# Patient Record
Sex: Female | Born: 1945 | Race: White | Hispanic: No | Marital: Married | Smoking: Never smoker
Health system: Southern US, Community
[De-identification: ages and names within clinical notes are randomized; demographics above are authoritative.]

## PROBLEM LIST (undated history)

## (undated) DIAGNOSIS — E119 Type 2 diabetes mellitus without complications: Secondary | ICD-10-CM

## (undated) DIAGNOSIS — R011 Cardiac murmur, unspecified: Secondary | ICD-10-CM

## (undated) DIAGNOSIS — E7800 Pure hypercholesterolemia, unspecified: Secondary | ICD-10-CM

## (undated) DIAGNOSIS — F419 Anxiety disorder, unspecified: Secondary | ICD-10-CM

## (undated) DIAGNOSIS — I10 Essential (primary) hypertension: Secondary | ICD-10-CM

## (undated) HISTORY — PX: OTHER SURGICAL HISTORY: SHX169

---

## 2009-03-23 ENCOUNTER — Ambulatory Visit: Payer: Self-pay | Admitting: Diagnostic Radiology

## 2009-03-23 ENCOUNTER — Emergency Department (HOSPITAL_BASED_OUTPATIENT_CLINIC_OR_DEPARTMENT_OTHER): Admission: EM | Admit: 2009-03-23 | Discharge: 2009-03-23 | Payer: Self-pay | Admitting: Emergency Medicine

## 2015-04-17 DIAGNOSIS — Z760 Encounter for issue of repeat prescription: Secondary | ICD-10-CM | POA: Diagnosis not present

## 2015-04-17 DIAGNOSIS — I10 Essential (primary) hypertension: Secondary | ICD-10-CM | POA: Diagnosis not present

## 2015-04-17 DIAGNOSIS — M130 Polyarthritis, unspecified: Secondary | ICD-10-CM | POA: Diagnosis not present

## 2015-04-17 DIAGNOSIS — E119 Type 2 diabetes mellitus without complications: Secondary | ICD-10-CM | POA: Diagnosis not present

## 2015-04-17 DIAGNOSIS — E782 Mixed hyperlipidemia: Secondary | ICD-10-CM | POA: Diagnosis not present

## 2015-08-28 DIAGNOSIS — E119 Type 2 diabetes mellitus without complications: Secondary | ICD-10-CM | POA: Diagnosis not present

## 2015-10-22 DIAGNOSIS — E785 Hyperlipidemia, unspecified: Secondary | ICD-10-CM | POA: Diagnosis not present

## 2015-10-22 DIAGNOSIS — E119 Type 2 diabetes mellitus without complications: Secondary | ICD-10-CM | POA: Diagnosis not present

## 2015-10-22 DIAGNOSIS — I10 Essential (primary) hypertension: Secondary | ICD-10-CM | POA: Diagnosis not present

## 2015-10-22 DIAGNOSIS — Z1159 Encounter for screening for other viral diseases: Secondary | ICD-10-CM | POA: Diagnosis not present

## 2015-10-30 DIAGNOSIS — Z23 Encounter for immunization: Secondary | ICD-10-CM | POA: Diagnosis not present

## 2015-10-30 DIAGNOSIS — I10 Essential (primary) hypertension: Secondary | ICD-10-CM | POA: Diagnosis not present

## 2015-10-30 DIAGNOSIS — S46811A Strain of other muscles, fascia and tendons at shoulder and upper arm level, right arm, initial encounter: Secondary | ICD-10-CM | POA: Diagnosis not present

## 2015-10-30 DIAGNOSIS — S46812A Strain of other muscles, fascia and tendons at shoulder and upper arm level, left arm, initial encounter: Secondary | ICD-10-CM | POA: Diagnosis not present

## 2015-10-30 DIAGNOSIS — E119 Type 2 diabetes mellitus without complications: Secondary | ICD-10-CM | POA: Diagnosis not present

## 2015-10-30 DIAGNOSIS — E785 Hyperlipidemia, unspecified: Secondary | ICD-10-CM | POA: Diagnosis not present

## 2015-12-31 DIAGNOSIS — Z23 Encounter for immunization: Secondary | ICD-10-CM | POA: Diagnosis not present

## 2016-05-05 DIAGNOSIS — Z1212 Encounter for screening for malignant neoplasm of rectum: Secondary | ICD-10-CM | POA: Diagnosis not present

## 2016-05-17 DIAGNOSIS — E119 Type 2 diabetes mellitus without complications: Secondary | ICD-10-CM | POA: Diagnosis not present

## 2016-05-17 DIAGNOSIS — E785 Hyperlipidemia, unspecified: Secondary | ICD-10-CM | POA: Diagnosis not present

## 2016-05-17 DIAGNOSIS — I10 Essential (primary) hypertension: Secondary | ICD-10-CM | POA: Diagnosis not present

## 2016-05-19 DIAGNOSIS — F411 Generalized anxiety disorder: Secondary | ICD-10-CM | POA: Diagnosis not present

## 2016-05-19 DIAGNOSIS — I10 Essential (primary) hypertension: Secondary | ICD-10-CM | POA: Diagnosis not present

## 2016-05-19 DIAGNOSIS — E785 Hyperlipidemia, unspecified: Secondary | ICD-10-CM | POA: Diagnosis not present

## 2016-05-19 DIAGNOSIS — J069 Acute upper respiratory infection, unspecified: Secondary | ICD-10-CM | POA: Diagnosis not present

## 2016-05-19 DIAGNOSIS — E119 Type 2 diabetes mellitus without complications: Secondary | ICD-10-CM | POA: Diagnosis not present

## 2016-05-26 DIAGNOSIS — E119 Type 2 diabetes mellitus without complications: Secondary | ICD-10-CM | POA: Diagnosis not present

## 2016-06-09 DIAGNOSIS — Z1231 Encounter for screening mammogram for malignant neoplasm of breast: Secondary | ICD-10-CM | POA: Diagnosis not present

## 2016-07-01 DIAGNOSIS — H6122 Impacted cerumen, left ear: Secondary | ICD-10-CM | POA: Diagnosis not present

## 2016-12-08 DIAGNOSIS — Z0000 Encounter for general adult medical examination without abnormal findings: Secondary | ICD-10-CM | POA: Diagnosis not present

## 2016-12-09 DIAGNOSIS — I10 Essential (primary) hypertension: Secondary | ICD-10-CM | POA: Diagnosis not present

## 2016-12-09 DIAGNOSIS — M25562 Pain in left knee: Secondary | ICD-10-CM | POA: Diagnosis not present

## 2016-12-09 DIAGNOSIS — E785 Hyperlipidemia, unspecified: Secondary | ICD-10-CM | POA: Diagnosis not present

## 2016-12-09 DIAGNOSIS — E119 Type 2 diabetes mellitus without complications: Secondary | ICD-10-CM | POA: Diagnosis not present

## 2016-12-09 DIAGNOSIS — Z23 Encounter for immunization: Secondary | ICD-10-CM | POA: Diagnosis not present

## 2016-12-26 DIAGNOSIS — M25562 Pain in left knee: Secondary | ICD-10-CM | POA: Diagnosis not present

## 2017-05-16 DIAGNOSIS — I10 Essential (primary) hypertension: Secondary | ICD-10-CM | POA: Diagnosis not present

## 2017-05-16 DIAGNOSIS — E785 Hyperlipidemia, unspecified: Secondary | ICD-10-CM | POA: Diagnosis not present

## 2017-05-16 DIAGNOSIS — R7989 Other specified abnormal findings of blood chemistry: Secondary | ICD-10-CM | POA: Diagnosis not present

## 2017-05-16 DIAGNOSIS — E119 Type 2 diabetes mellitus without complications: Secondary | ICD-10-CM | POA: Diagnosis not present

## 2017-05-16 DIAGNOSIS — Z7984 Long term (current) use of oral hypoglycemic drugs: Secondary | ICD-10-CM | POA: Diagnosis not present

## 2017-05-30 DIAGNOSIS — R7989 Other specified abnormal findings of blood chemistry: Secondary | ICD-10-CM | POA: Diagnosis not present

## 2017-12-08 DIAGNOSIS — H2513 Age-related nuclear cataract, bilateral: Secondary | ICD-10-CM | POA: Diagnosis not present

## 2018-01-04 DIAGNOSIS — E119 Type 2 diabetes mellitus without complications: Secondary | ICD-10-CM | POA: Diagnosis not present

## 2018-01-04 DIAGNOSIS — Z23 Encounter for immunization: Secondary | ICD-10-CM | POA: Diagnosis not present

## 2018-01-04 DIAGNOSIS — E785 Hyperlipidemia, unspecified: Secondary | ICD-10-CM | POA: Diagnosis not present

## 2018-01-04 DIAGNOSIS — I10 Essential (primary) hypertension: Secondary | ICD-10-CM | POA: Diagnosis not present

## 2018-03-11 DIAGNOSIS — J40 Bronchitis, not specified as acute or chronic: Secondary | ICD-10-CM | POA: Diagnosis not present

## 2018-03-11 DIAGNOSIS — J069 Acute upper respiratory infection, unspecified: Secondary | ICD-10-CM | POA: Diagnosis not present

## 2018-03-11 DIAGNOSIS — R531 Weakness: Secondary | ICD-10-CM | POA: Diagnosis not present

## 2018-09-28 ENCOUNTER — Other Ambulatory Visit: Payer: Self-pay

## 2018-12-13 DIAGNOSIS — Z23 Encounter for immunization: Secondary | ICD-10-CM | POA: Diagnosis not present

## 2018-12-20 DIAGNOSIS — E785 Hyperlipidemia, unspecified: Secondary | ICD-10-CM | POA: Diagnosis not present

## 2018-12-20 DIAGNOSIS — I10 Essential (primary) hypertension: Secondary | ICD-10-CM | POA: Diagnosis not present

## 2018-12-20 DIAGNOSIS — Z7984 Long term (current) use of oral hypoglycemic drugs: Secondary | ICD-10-CM | POA: Diagnosis not present

## 2018-12-20 DIAGNOSIS — E119 Type 2 diabetes mellitus without complications: Secondary | ICD-10-CM | POA: Diagnosis not present

## 2019-01-03 DIAGNOSIS — R809 Proteinuria, unspecified: Secondary | ICD-10-CM | POA: Diagnosis not present

## 2019-02-05 ENCOUNTER — Encounter (HOSPITAL_BASED_OUTPATIENT_CLINIC_OR_DEPARTMENT_OTHER): Payer: Self-pay

## 2019-02-05 ENCOUNTER — Emergency Department (HOSPITAL_BASED_OUTPATIENT_CLINIC_OR_DEPARTMENT_OTHER): Payer: PPO

## 2019-02-05 ENCOUNTER — Other Ambulatory Visit: Payer: Self-pay

## 2019-02-05 ENCOUNTER — Emergency Department (HOSPITAL_BASED_OUTPATIENT_CLINIC_OR_DEPARTMENT_OTHER)
Admission: EM | Admit: 2019-02-05 | Discharge: 2019-02-05 | Disposition: A | Discharge status: Home / Self Care | Payer: PPO | Attending: Emergency Medicine | Admitting: Emergency Medicine

## 2019-02-05 DIAGNOSIS — M660 Rupture of popliteal cyst: Secondary | ICD-10-CM | POA: Diagnosis not present

## 2019-02-05 DIAGNOSIS — R600 Localized edema: Secondary | ICD-10-CM | POA: Diagnosis not present

## 2019-02-05 DIAGNOSIS — M7121 Synovial cyst of popliteal space [Baker], right knee: Secondary | ICD-10-CM | POA: Diagnosis not present

## 2019-02-05 DIAGNOSIS — M79661 Pain in right lower leg: Secondary | ICD-10-CM | POA: Diagnosis not present

## 2019-02-05 DIAGNOSIS — E119 Type 2 diabetes mellitus without complications: Secondary | ICD-10-CM | POA: Diagnosis not present

## 2019-02-05 DIAGNOSIS — M7989 Other specified soft tissue disorders: Secondary | ICD-10-CM | POA: Diagnosis not present

## 2019-02-05 HISTORY — DX: Pure hypercholesterolemia, unspecified: E78.00

## 2019-02-05 HISTORY — DX: Essential (primary) hypertension: I10

## 2019-02-05 HISTORY — DX: Type 2 diabetes mellitus without complications: E11.9

## 2019-02-05 NOTE — ED Provider Notes (Signed)
[facility] EMERGENCY DEPARTMENT Provider Note   CSN: [id number] Arrival date & time: 02/05/19  1903     History   Chief Complaint Chief Complaint  Patient presents with  . Leg Swelling    HPI Morgan Hall is a 73 y.o. female.  73 year old female diabetic here with 3 days of right knee and calf pain.  No known trauma.  Went to urgent care and they sent her here to get a duplex.  No prior history of DVT.  Very active.  No numbness or tingling.  She said the pain radiates from the back of her thigh through the back of her knee and down into her calf with some swelling and redness.  No known fever.     The history is provided by the patient.  Leg Pain Location:  Leg and knee Time since incident:  3 days Injury: no   Leg location:  R upper leg and R lower leg Knee location:  R knee Pain details:    Quality:  Aching   Severity:  Moderate   Onset quality:  Gradual   Timing:  Constant   Progression:  Unchanged Chronicity:  New Relieved by:  None tried Worsened by:  Activity Ineffective treatments:  None tried Associated symptoms: back pain (chronic) and swelling   Associated symptoms: no fever, no muscle weakness and no tingling     Past Medical History:  Diagnosis Date  . Diabetes mellitus without complication (HCC)   . High cholesterol   . Hypertension     There are no active problems to display for this patient.   History reviewed. No pertinent surgical history.   OB History   No obstetric history on file.      Home Medications    Prior to Admission medications   Not on File    Family History No family history on file.  Social History Social History   Tobacco Use  . Smoking status: Never Smoker  . Smokeless tobacco: Never Used  Substance Use Topics  . Alcohol use: Never    Frequency: Never  . Drug use: Never     Allergies   Penicillins and Robitussin (alcohol free) [guaifenesin]   Review of Systems Review of Systems   Constitutional: Negative for fever.  HENT: Negative for sore throat.   Eyes: Negative for visual disturbance.  Respiratory: Negative for shortness of breath.   Cardiovascular: Negative for chest pain.  Gastrointestinal: Negative for abdominal pain.  Genitourinary: Negative for dysuria.  Musculoskeletal: Positive for back pain (chronic).  Skin: Negative for rash.  Neurological: Negative for headaches.     Physical Exam Updated Vital Signs BP (!) 190/79 (BP Location: Right Arm)   Pulse 94   Temp 99.1 F (37.3 C) (Oral)   Resp 18   Ht 5\' 2"  (1.575 m)   Wt 72.6 kg   SpO2 97%   BMI 29.26 kg/m   Physical Exam Constitutional:      Appearance: She is well-developed.  HENT:     Head: Normocephalic and atraumatic.  Eyes:     Conjunctiva/sclera: Conjunctivae normal.  Neck:     Musculoskeletal: Neck supple.  Cardiovascular:     Rate and Rhythm: Normal rate and regular rhythm.     Pulses: Normal pulses.  Pulmonary:     Effort: Pulmonary effort is normal.  Abdominal:     Tenderness: There is no abdominal tenderness.  Musculoskeletal: Normal range of motion.        General: Swelling and  tenderness present.     Left lower leg: Edema present.     Comments: She is some tenderness behind her right knee and into her calf.  There is a mild amount of swelling.  No appreciable warmth or erythema.  Distal neurovascular intact.  Left lower extremity unaffected.  Skin:    General: Skin is warm and dry.     Capillary Refill: Capillary refill takes less than 2 seconds.  Neurological:     General: No focal deficit present.     Mental Status: She is alert.     GCS: GCS eye subscore is 4. GCS verbal subscore is 5. GCS motor subscore is 6.      ED Treatments / Results  Labs (all labs ordered are listed, but only abnormal results are displayed) Labs Reviewed - No data to display  EKG None  Radiology US Venous Img Lower Unilateral Right  Result Date: 02/05/2019 CLINICAL DATA:  Pain  EXAM: RIGHT LOWER EXTREMITY VENOUS DOPPLER ULTRASOUND TECHNIQUE: Gray-scale sonography with graded compression, as well as color Doppler and duplex ultrasound were performed to evaluate the lower extremity deep venous systems from the level of the common femoral vein and including the common femoral, femoral, profunda femoral, popliteal and calf veins including the posterior tibial, peroneal and gastrocnemius veins when visible. The superficial great saphenous vein was also interrogated. Spectral Doppler was utilized to evaluate flow at rest and with distal augmentation maneuvers in the common femoral, femoral and popliteal veins. COMPARISON:  None. FINDINGS: Contralateral Common Femoral Vein: Respiratory phasicity is normal and symmetric with the symptomatic side. No evidence of thrombus. Normal compressibility. Common Femoral Vein: No evidence of thrombus. Normal compressibility, respiratory phasicity and response to augmentation. Saphenofemoral Junction: No evidence of thrombus. Normal compressibility and flow on color Doppler imaging. Profunda Femoral Vein: No evidence of thrombus. Normal compressibility and flow on color Doppler imaging. Femoral Vein: No evidence of thrombus. Normal compressibility, respiratory phasicity and response to augmentation. Popliteal Vein: No evidence of thrombus. Normal compressibility, respiratory phasicity and response to augmentation. Calf Veins: No evidence of thrombus. Normal compressibility and flow on color Doppler imaging. Superficial Great Saphenous Vein: No evidence of thrombus. Normal compressibility. Venous Reflux:  Not evaluated on this exam. Other Findings: There is right lower extremity edema. There is a 4.8 x 1.3 x 5.1 cm Baker's cyst that appears to have ruptured in has dissected inferiorly. IMPRESSION: 1. No sonographic evidence for a DVT. 2. Right-sided dissecting Baker's cyst. Electronically Signed   By: Constance Holster M.D.   On: 02/05/2019 20:25     Procedures Procedures (including critical care time)  Medications Ordered in ED Medications - No data to display   Initial Impression / Assessment and Plan / ED Course  I have reviewed the triage vital signs and the nursing notes.  Pertinent labs & imaging results that were available during my care of the patient were reviewed by me and considered in my medical decision making (see chart for details).    D/w dr Wynelle Link ortho - recommend elevate, pain control and compression stocking. Reviewed with patient and daughter. They are comfortable with plan and all questions answered.       Final Clinical Impressions(s) / ED Diagnoses   Final diagnoses:  Ruptured Bakers cyst    ED Discharge Orders    None       Hayden Rasmussen, MD 02/06/19 1128

## 2019-02-05 NOTE — ED Notes (Signed)
Returned from U/S

## 2019-02-05 NOTE — Discharge Instructions (Signed)
You were seen in the emergency department for right leg pain.  Your ultrasound did not show any blood clot but you did have a Baker's cyst.  You should use Tylenol and ibuprofen for pain, elevate your leg.  A compression stocking may also help your symptoms.  Follow-up with orthopedics if your symptoms are not improving.

## 2019-02-05 NOTE — ED Triage Notes (Signed)
Pt c/o pain/swelling to right LE x 1 week-sent from UC to r/o DVT-NAD-to triage in w/c

## 2019-02-07 DIAGNOSIS — M25561 Pain in right knee: Secondary | ICD-10-CM | POA: Diagnosis not present

## 2019-02-08 DIAGNOSIS — M25461 Effusion, right knee: Secondary | ICD-10-CM | POA: Diagnosis not present

## 2019-02-08 DIAGNOSIS — M25861 Other specified joint disorders, right knee: Secondary | ICD-10-CM | POA: Diagnosis not present

## 2019-02-08 DIAGNOSIS — M1711 Unilateral primary osteoarthritis, right knee: Secondary | ICD-10-CM | POA: Diagnosis not present

## 2019-02-27 DIAGNOSIS — R109 Unspecified abdominal pain: Secondary | ICD-10-CM | POA: Diagnosis not present

## 2019-02-27 DIAGNOSIS — R0602 Shortness of breath: Secondary | ICD-10-CM | POA: Diagnosis not present

## 2019-02-27 DIAGNOSIS — K219 Gastro-esophageal reflux disease without esophagitis: Secondary | ICD-10-CM | POA: Diagnosis not present

## 2019-02-27 DIAGNOSIS — R5383 Other fatigue: Secondary | ICD-10-CM | POA: Diagnosis not present

## 2019-03-29 DIAGNOSIS — Z20828 Contact with and (suspected) exposure to other viral communicable diseases: Secondary | ICD-10-CM | POA: Diagnosis not present

## 2019-03-31 ENCOUNTER — Ambulatory Visit: Payer: PPO

## 2019-04-11 ENCOUNTER — Ambulatory Visit: Payer: PPO

## 2019-04-23 DIAGNOSIS — Z8616 Personal history of COVID-19: Secondary | ICD-10-CM | POA: Diagnosis not present

## 2019-04-23 DIAGNOSIS — E119 Type 2 diabetes mellitus without complications: Secondary | ICD-10-CM | POA: Diagnosis not present

## 2019-04-23 DIAGNOSIS — Z13820 Encounter for screening for osteoporosis: Secondary | ICD-10-CM | POA: Diagnosis not present

## 2019-04-23 DIAGNOSIS — R05 Cough: Secondary | ICD-10-CM | POA: Diagnosis not present

## 2019-05-09 DIAGNOSIS — Z780 Asymptomatic menopausal state: Secondary | ICD-10-CM | POA: Diagnosis not present

## 2019-05-09 DIAGNOSIS — Z13820 Encounter for screening for osteoporosis: Secondary | ICD-10-CM | POA: Diagnosis not present

## 2019-05-09 DIAGNOSIS — M8588 Other specified disorders of bone density and structure, other site: Secondary | ICD-10-CM | POA: Diagnosis not present

## 2019-05-09 DIAGNOSIS — M8589 Other specified disorders of bone density and structure, multiple sites: Secondary | ICD-10-CM | POA: Diagnosis not present

## 2019-06-11 DIAGNOSIS — K219 Gastro-esophageal reflux disease without esophagitis: Secondary | ICD-10-CM | POA: Diagnosis not present

## 2019-06-11 DIAGNOSIS — Z1329 Encounter for screening for other suspected endocrine disorder: Secondary | ICD-10-CM | POA: Diagnosis not present

## 2019-06-11 DIAGNOSIS — I10 Essential (primary) hypertension: Secondary | ICD-10-CM | POA: Diagnosis not present

## 2019-06-11 DIAGNOSIS — E785 Hyperlipidemia, unspecified: Secondary | ICD-10-CM | POA: Diagnosis not present

## 2019-06-11 DIAGNOSIS — E119 Type 2 diabetes mellitus without complications: Secondary | ICD-10-CM | POA: Diagnosis not present

## 2019-06-13 DIAGNOSIS — K219 Gastro-esophageal reflux disease without esophagitis: Secondary | ICD-10-CM | POA: Diagnosis not present

## 2019-06-13 DIAGNOSIS — E785 Hyperlipidemia, unspecified: Secondary | ICD-10-CM | POA: Diagnosis not present

## 2019-06-13 DIAGNOSIS — Z1329 Encounter for screening for other suspected endocrine disorder: Secondary | ICD-10-CM | POA: Diagnosis not present

## 2019-06-13 DIAGNOSIS — E119 Type 2 diabetes mellitus without complications: Secondary | ICD-10-CM | POA: Diagnosis not present

## 2019-07-26 DIAGNOSIS — M25461 Effusion, right knee: Secondary | ICD-10-CM | POA: Diagnosis not present

## 2019-07-26 DIAGNOSIS — S83241A Other tear of medial meniscus, current injury, right knee, initial encounter: Secondary | ICD-10-CM | POA: Diagnosis not present

## 2019-07-26 DIAGNOSIS — M25561 Pain in right knee: Secondary | ICD-10-CM | POA: Diagnosis not present

## 2019-08-02 DIAGNOSIS — M1711 Unilateral primary osteoarthritis, right knee: Secondary | ICD-10-CM | POA: Diagnosis not present

## 2019-08-23 DIAGNOSIS — R2689 Other abnormalities of gait and mobility: Secondary | ICD-10-CM | POA: Diagnosis not present

## 2019-08-23 DIAGNOSIS — M25561 Pain in right knee: Secondary | ICD-10-CM | POA: Diagnosis not present

## 2019-08-23 DIAGNOSIS — Z7409 Other reduced mobility: Secondary | ICD-10-CM | POA: Diagnosis not present

## 2019-08-23 DIAGNOSIS — M25461 Effusion, right knee: Secondary | ICD-10-CM | POA: Diagnosis not present

## 2019-08-23 DIAGNOSIS — M1711 Unilateral primary osteoarthritis, right knee: Secondary | ICD-10-CM | POA: Diagnosis not present

## 2019-08-23 DIAGNOSIS — R29898 Other symptoms and signs involving the musculoskeletal system: Secondary | ICD-10-CM | POA: Diagnosis not present

## 2019-08-31 DIAGNOSIS — R2689 Other abnormalities of gait and mobility: Secondary | ICD-10-CM | POA: Diagnosis not present

## 2019-08-31 DIAGNOSIS — R29898 Other symptoms and signs involving the musculoskeletal system: Secondary | ICD-10-CM | POA: Diagnosis not present

## 2019-08-31 DIAGNOSIS — M25561 Pain in right knee: Secondary | ICD-10-CM | POA: Diagnosis not present

## 2019-08-31 DIAGNOSIS — M25461 Effusion, right knee: Secondary | ICD-10-CM | POA: Diagnosis not present

## 2019-08-31 DIAGNOSIS — M1711 Unilateral primary osteoarthritis, right knee: Secondary | ICD-10-CM | POA: Diagnosis not present

## 2019-08-31 DIAGNOSIS — Z7409 Other reduced mobility: Secondary | ICD-10-CM | POA: Diagnosis not present

## 2019-11-14 DIAGNOSIS — I10 Essential (primary) hypertension: Secondary | ICD-10-CM | POA: Diagnosis not present

## 2019-11-14 DIAGNOSIS — E119 Type 2 diabetes mellitus without complications: Secondary | ICD-10-CM | POA: Diagnosis not present

## 2019-11-14 DIAGNOSIS — E785 Hyperlipidemia, unspecified: Secondary | ICD-10-CM | POA: Diagnosis not present

## 2020-01-10 DIAGNOSIS — E785 Hyperlipidemia, unspecified: Secondary | ICD-10-CM | POA: Diagnosis not present

## 2020-01-10 DIAGNOSIS — I10 Essential (primary) hypertension: Secondary | ICD-10-CM | POA: Diagnosis not present

## 2020-01-10 DIAGNOSIS — E119 Type 2 diabetes mellitus without complications: Secondary | ICD-10-CM | POA: Diagnosis not present

## 2020-01-17 DIAGNOSIS — M170 Bilateral primary osteoarthritis of knee: Secondary | ICD-10-CM | POA: Diagnosis not present

## 2020-01-17 DIAGNOSIS — M1711 Unilateral primary osteoarthritis, right knee: Secondary | ICD-10-CM | POA: Diagnosis not present

## 2020-01-17 DIAGNOSIS — M25461 Effusion, right knee: Secondary | ICD-10-CM | POA: Diagnosis not present

## 2020-01-17 DIAGNOSIS — M25462 Effusion, left knee: Secondary | ICD-10-CM | POA: Diagnosis not present

## 2020-02-07 DIAGNOSIS — J40 Bronchitis, not specified as acute or chronic: Secondary | ICD-10-CM | POA: Diagnosis not present

## 2020-02-17 DIAGNOSIS — J40 Bronchitis, not specified as acute or chronic: Secondary | ICD-10-CM | POA: Diagnosis not present

## 2020-02-17 DIAGNOSIS — K219 Gastro-esophageal reflux disease without esophagitis: Secondary | ICD-10-CM | POA: Diagnosis not present

## 2020-02-17 DIAGNOSIS — I159 Secondary hypertension, unspecified: Secondary | ICD-10-CM | POA: Diagnosis not present

## 2020-02-17 DIAGNOSIS — R059 Cough, unspecified: Secondary | ICD-10-CM | POA: Diagnosis not present

## 2020-03-12 DIAGNOSIS — M1712 Unilateral primary osteoarthritis, left knee: Secondary | ICD-10-CM | POA: Diagnosis not present

## 2020-03-12 DIAGNOSIS — M1711 Unilateral primary osteoarthritis, right knee: Secondary | ICD-10-CM | POA: Diagnosis not present

## 2020-05-29 DIAGNOSIS — M170 Bilateral primary osteoarthritis of knee: Secondary | ICD-10-CM | POA: Diagnosis not present

## 2020-05-29 DIAGNOSIS — M1711 Unilateral primary osteoarthritis, right knee: Secondary | ICD-10-CM | POA: Diagnosis not present

## 2020-06-23 DIAGNOSIS — Z7984 Long term (current) use of oral hypoglycemic drugs: Secondary | ICD-10-CM | POA: Diagnosis not present

## 2020-06-23 DIAGNOSIS — H02831 Dermatochalasis of right upper eyelid: Secondary | ICD-10-CM | POA: Diagnosis not present

## 2020-06-23 DIAGNOSIS — H2513 Age-related nuclear cataract, bilateral: Secondary | ICD-10-CM | POA: Diagnosis not present

## 2020-06-23 DIAGNOSIS — H02834 Dermatochalasis of left upper eyelid: Secondary | ICD-10-CM | POA: Diagnosis not present

## 2020-06-23 DIAGNOSIS — H524 Presbyopia: Secondary | ICD-10-CM | POA: Diagnosis not present

## 2020-06-23 DIAGNOSIS — H5203 Hypermetropia, bilateral: Secondary | ICD-10-CM | POA: Diagnosis not present

## 2020-06-23 DIAGNOSIS — H52203 Unspecified astigmatism, bilateral: Secondary | ICD-10-CM | POA: Diagnosis not present

## 2020-06-23 DIAGNOSIS — E119 Type 2 diabetes mellitus without complications: Secondary | ICD-10-CM | POA: Diagnosis not present

## 2020-07-10 DIAGNOSIS — E785 Hyperlipidemia, unspecified: Secondary | ICD-10-CM | POA: Diagnosis not present

## 2020-07-10 DIAGNOSIS — Z01818 Encounter for other preprocedural examination: Secondary | ICD-10-CM | POA: Diagnosis not present

## 2020-07-10 DIAGNOSIS — E119 Type 2 diabetes mellitus without complications: Secondary | ICD-10-CM | POA: Diagnosis not present

## 2020-07-10 DIAGNOSIS — I10 Essential (primary) hypertension: Secondary | ICD-10-CM | POA: Diagnosis not present

## 2020-07-15 DIAGNOSIS — Z01818 Encounter for other preprocedural examination: Secondary | ICD-10-CM | POA: Diagnosis not present

## 2020-07-15 DIAGNOSIS — M170 Bilateral primary osteoarthritis of knee: Secondary | ICD-10-CM | POA: Diagnosis not present

## 2020-07-15 DIAGNOSIS — I10 Essential (primary) hypertension: Secondary | ICD-10-CM | POA: Diagnosis not present

## 2020-07-15 DIAGNOSIS — E119 Type 2 diabetes mellitus without complications: Secondary | ICD-10-CM | POA: Diagnosis not present

## 2020-07-15 DIAGNOSIS — E782 Mixed hyperlipidemia: Secondary | ICD-10-CM | POA: Diagnosis not present

## 2020-07-30 NOTE — Patient Instructions (Addendum)
DUE TO COVID-19 ONLY ONE VISITOR IS ALLOWED TO COME WITH YOU AND STAY IN THE WAITING ROOM ONLY DURING PRE OP AND PROCEDURE DAY OF SURGERY. THE 2 VISITORS  MAY VISIT WITH YOU AFTER SURGERY IN YOUR PRIVATE ROOM DURING VISITING HOURS ONLY!  YOU NEED TO HAVE A COVID 19 TEST ON_6/9_ @ 0915, THIS TEST MUST BE DONE BEFORE SURGERY,  COVID TESTING SITE [address] [place] [address], IT IS ON THE RIGHT GOING OUT [address] APPROXIMATELY  2 MINUTES PAST [employer] ON THE RIGHT. ONCE YOUR COVID TEST IS COMPLETED,  PLEASE BEGIN THE QUARANTINE INSTRUCTIONS AS OUTLINED IN YOUR HANDOUT.                Morgan Hall   Your procedure is scheduled on: 08/11/20   Report to [facility] Main  Entrance   Report to short stay at 5:15 AM     Call this number if you have problems the morning of surgery [phone]    BRUSH YOUR TEETH MORNING OF SURGERY AND RINSE YOUR MOUTH OUT, NO CHEWING GUM CANDY OR MINTS.  No food after midnight.    You may have clear liquid until 4:15 AM.    At 4:15 AM drink pre surgery drink  . Nothing by mouth after 4:15 AM.    Take these medicines the morning of surgery with A SIP OF WATER: Carvedilol, Amlodipine, Buspirone also can take medication prescription Dr Lequita Halt orders for morning of surgery for anxiety        How to Manage Your Diabetes Before and After Surgery  Why is it important to control my blood sugar before and after surgery? . Improving blood sugar levels before and after surgery helps healing and can limit problems. . A way of improving blood sugar control is eating a healthy diet by: o  Eating less sugar and carbohydrates o  Increasing activity/exercise o  Talking with your doctor about reaching your blood sugar goals . High blood sugars (greater than 180 mg/dL) can raise your risk of infections and slow your recovery, so you will need to focus on controlling your diabetes during the weeks before surgery. . Make sure  that the doctor who takes care of your diabetes knows about your planned surgery including the date and location.  How do I manage my blood sugar before surgery? . Check your blood sugar at least 4 times a day, starting 2 days before surgery, to make sure that the level is not too high or low. o Check your blood sugar the morning of your surgery when you wake up and every 2 hours until you get to the Short Stay unit. . If your blood sugar is less than 70 mg/dL, you will need to treat for low blood sugar: o Do not take insulin. o Treat a low blood sugar (less than 70 mg/dL) with  cup of clear juice (cranberry or apple), 4 glucose tablets, OR glucose gel. o Recheck blood sugar in 15 minutes after treatment (to make sure it is greater than 70 mg/dL). If your blood sugar is not greater than 70 mg/dL on recheck, call [phone] for further instructions. . Report your blood sugar to the short stay nurse when you get to Short Stay.  . If you are admitted to the hospital after surgery: o Your blood sugar will be checked by the staff and you will probably be given insulin after surgery (instead of oral diabetes medicines) to make sure you have  good blood sugar levels. o The goal for blood sugar control after surgery is 80-180 mg/dL.   WHAT DO I DO ABOUT MY DIABETES MEDICATION?  Marland Kitchen Do not take oral diabetes medicines (pills) the morning of surgery.   . The day of surgery, do not take other diabetes injectables, including Byetta (exenatide), Bydureon (exenatide ER), Victoza (liraglutide), or Trulicity (dulaglutide).                        You may not have any metal on your body including hair pins and              piercings  Do not wear jewelry, make-up, lotions, powders or perfumes, deodorant             Do not wear nail polish on your fingernails.  Do not shave  48 hours prior to surgery.     Do not bring valuables to the hospital. [facility] IS NOT             RESPONSIBLE   FOR  VALUABLES.  Contacts, dentures or bridgework may not be worn into surgery.      _____________________________________________________________________             [facility] - Preparing for Surgery Before surgery, you can play an important role.  Because skin is not sterile, your skin needs to be as free of germs as possible.  You can reduce the number of germs on your skin by washing with CHG (chlorahexidine gluconate) soap before surgery.  CHG is an antiseptic cleaner which kills germs and bonds with the skin to continue killing germs even after washing. Please DO NOT use if you have an allergy to CHG or antibacterial soaps.  If your skin becomes reddened/irritated stop using the CHG and inform your nurse when you arrive at Short Stay. Do not shave (including legs and underarms) for at least 48 hours prior to the first CHG shower.  Please follow these instructions carefully:  1.  Shower with CHG Soap the night before surgery and the  morning of Surgery.  2.  If you choose to wash your hair, wash your hair first as usual with your  normal  shampoo.  3.  After you shampoo, rinse your hair and body thoroughly to remove the  shampoo.                                        4.  Use CHG as you would any other liquid soap.  You can apply chg directly  to the skin and wash                       Gently with a scrungie or clean washcloth.  5.  Apply the CHG Soap to your body ONLY FROM THE NECK DOWN.   Do not use on face/ open                           Wound or open sores. Avoid contact with eyes, ears mouth and genitals (private parts).                       Wash face,  Genitals (private parts) with your normal soap.  6.  Wash thoroughly, paying special attention to the area where your surgery  will be performed.  7.  Thoroughly rinse your body with warm water from the neck down.  8.  DO NOT shower/wash with your normal soap after using and rinsing off  the CHG Soap.             9.  Pat  yourself dry with a clean towel.            10.  Wear clean pajamas.            11.  Place clean sheets on your bed the night of your first shower and do not  sleep with pets. Day of Surgery : Do not apply any lotions/deodorants the morning of surgery.  Please wear clean clothes to the hospital/surgery center.  FAILURE TO FOLLOW THESE INSTRUCTIONS MAY RESULT IN THE CANCELLATION OF YOUR SURGERY PATIENT SIGNATURE_________________________________  NURSE SIGNATURE__________________________________  ________________________________________________________________________   Rogelia Mire  An incentive spirometer is a tool that can help keep your lungs clear and active. This tool measures how well you are filling your lungs with each breath. Taking long deep breaths may help reverse or decrease the chance of developing breathing (pulmonary) problems (especially infection) following:  A long period of time when you are unable to move or be active. BEFORE THE PROCEDURE   If the spirometer includes an indicator to show your best effort, your nurse or respiratory therapist will set it to a desired goal.  If possible, sit up straight or lean slightly forward. Try not to slouch.  Hold the incentive spirometer in an upright position. INSTRUCTIONS FOR USE  1. Sit on the edge of your bed if possible, or sit up as far as you can in bed or on a chair. 2. Hold the incentive spirometer in an upright position. 3. Breathe out normally. 4. Place the mouthpiece in your mouth and seal your lips tightly around it. 5. Breathe in slowly and as deeply as possible, raising the piston or the ball toward the top of the column. 6. Hold your breath for 3-5 seconds or for as long as possible. Allow the piston or ball to fall to the bottom of the column. 7. Remove the mouthpiece from your mouth and breathe out normally. 8. Rest for a few seconds and repeat Steps 1 through 7 at least 10 times every 1-2 hours  when you are awake. Take your time and take a few normal breaths between deep breaths. 9. The spirometer may include an indicator to show your best effort. Use the indicator as a goal to work toward during each repetition. 10. After each set of 10 deep breaths, practice coughing to be sure your lungs are clear. If you have an incision (the cut made at the time of surgery), support your incision when coughing by placing a pillow or rolled up towels firmly against it. Once you are able to get out of bed, walk around indoors and cough well. You may stop using the incentive spirometer when instructed by your caregiver.  RISKS AND COMPLICATIONS  Take your time so you do not get dizzy or light-headed.  If you are in pain, you may need to take or ask for pain medication before doing incentive spirometry. It is harder to take a deep breath if you are having pain. AFTER USE  Rest and breathe slowly and easily.  It can be helpful to keep track of a log of your progress. Your  caregiver can provide you with a simple table to help with this. If you are using the spirometer at home, follow these instructions: [place] IF:   You are having difficultly using the spirometer.  You have trouble using the spirometer as often as instructed.  Your pain medication is not giving enough relief while using the spirometer.  You develop fever of 100.5 F (38.1 C) or higher. SEEK IMMEDIATE MEDICAL CARE IF:   You cough up bloody sputum that had not been present before.  You develop fever of 102 F (38.9 C) or greater.  You develop worsening pain at or near the incision site. MAKE SURE YOU:   Understand these instructions.  Will watch your condition.  Will get help right away if you are not doing well or get worse. Document Released: 06/28/2006 Document Revised: 05/10/2011 Document Reviewed: 08/29/2006 [facility] Patient Information 2014 [place],  [place].   ________________________________________________________________________

## 2020-08-06 ENCOUNTER — Other Ambulatory Visit: Payer: Self-pay

## 2020-08-06 ENCOUNTER — Encounter (HOSPITAL_COMMUNITY)
Admission: RE | Admit: 2020-08-06 | Discharge: 2020-08-06 | Disposition: A | Discharge status: Home / Self Care | Payer: PPO | Source: Ambulatory Visit | Attending: Orthopedic Surgery | Admitting: Orthopedic Surgery

## 2020-08-06 ENCOUNTER — Encounter (HOSPITAL_COMMUNITY): Payer: Self-pay

## 2020-08-06 DIAGNOSIS — Z01812 Encounter for preprocedural laboratory examination: Secondary | ICD-10-CM | POA: Insufficient documentation

## 2020-08-06 HISTORY — DX: Cardiac murmur, unspecified: R01.1

## 2020-08-06 HISTORY — DX: Anxiety disorder, unspecified: F41.9

## 2020-08-06 LAB — COMPREHENSIVE METABOLIC PANEL
ALT: 28 U/L (ref 0–44)
AST: 20 U/L (ref 15–41)
Albumin: 4.6 g/dL (ref 3.5–5.0)
Alkaline Phosphatase: 61 U/L (ref 38–126)
Anion gap: 11 (ref 5–15)
BUN: 18 mg/dL (ref 8–23)
CO2: 22 mmol/L (ref 22–32)
Calcium: 9.9 mg/dL (ref 8.9–10.3)
Chloride: 104 mmol/L (ref 98–111)
Creatinine, Ser: 0.69 mg/dL (ref 0.44–1.00)
GFR, Estimated: >60 mL/min (ref >60)
Glucose, Bld: 111 mg/dL — ABNORMAL HIGH (ref 70–99)
Potassium: 4.4 mmol/L (ref 3.5–5.1)
Sodium: 137 mmol/L (ref 135–145)
Total Bilirubin: 0.3 mg/dL (ref 0.3–1.2)
Total Protein: 8.8 g/dL — ABNORMAL HIGH (ref 6.5–8.1)

## 2020-08-06 LAB — CBC
HCT: 42.3 % (ref 36.0–46.0)
Hemoglobin: 13.9 g/dL (ref 12.0–15.0)
MCH: 30.4 pg (ref 26.0–34.0)
MCHC: 32.9 g/dL (ref 30.0–36.0)
MCV: 92.6 fL (ref 80.0–100.0)
Platelets: 276 10*3/uL (ref 150–400)
RBC: 4.57 MIL/uL (ref 3.87–5.11)
RDW: 13 % (ref 11.5–15.5)
WBC: 12.3 10*3/uL — ABNORMAL HIGH (ref 4.0–10.5)
nRBC: 0 % (ref 0.0–0.2)

## 2020-08-06 LAB — GLUCOSE, CAPILLARY: Glucose-Capillary: 126 mg/dL — ABNORMAL HIGH (ref 70–99)

## 2020-08-06 LAB — PROTIME-INR
INR: 1 (ref 0.8–1.2)
Prothrombin Time: 12.9 seconds (ref 11.4–15.2)

## 2020-08-06 LAB — SURGICAL PCR SCREEN
MRSA, PCR: NEGATIVE
Staphylococcus aureus: NEGATIVE

## 2020-08-06 NOTE — Progress Notes (Signed)
PCP Dr Michelle Nasuti  Clearance note per chart  EKG per chart 07/15/2020  Glucose reading per PAT visit 08/06/2020 (CBG) 126  A1C per chart 6.8 07/10/2020  Patient denies shortness of breath, fever, cough and chest pain at PAT appointment  Patient verbalized understanding of instructions that were given to them at the PAT appointment. Patient was also instructed that they will need to review over the PAT instructions again at home before surgery.

## 2020-08-07 ENCOUNTER — Other Ambulatory Visit (HOSPITAL_COMMUNITY)
Admission: RE | Admit: 2020-08-07 | Discharge: 2020-08-07 | Disposition: A | Discharge status: Home / Self Care | Payer: PPO | Source: Ambulatory Visit | Attending: Orthopedic Surgery | Admitting: Orthopedic Surgery

## 2020-08-07 DIAGNOSIS — Z01812 Encounter for preprocedural laboratory examination: Secondary | ICD-10-CM | POA: Insufficient documentation

## 2020-08-07 DIAGNOSIS — Z20822 Contact with and (suspected) exposure to covid-19: Secondary | ICD-10-CM | POA: Diagnosis not present

## 2020-08-07 LAB — SARS CORONAVIRUS 2 (TAT 6-24 HRS): SARS Coronavirus 2: NEGATIVE

## 2020-08-09 NOTE — Anesthesia Preprocedure Evaluation (Addendum)
Anesthesia Evaluation  Patient identified by MRN, date of birth, ID band Patient awake    Reviewed: Allergy & Precautions, NPO status , Patient's Chart, lab work & pertinent test results  History of Anesthesia Complications Negative for: history of anesthetic complications  Airway Mallampati: II  TM Distance: >3 FB Neck ROM: Full    Dental   Pulmonary neg pulmonary ROS,    Pulmonary exam normal        Cardiovascular hypertension, Pt. on medications and Pt. on home beta blockers Normal cardiovascular exam     Neuro/Psych negative neurological ROS     GI/Hepatic negative GI ROS, Neg liver ROS,   Endo/Other  diabetes, Oral Hypoglycemic Agents  Renal/GU negative Renal ROS  negative genitourinary   Musculoskeletal negative musculoskeletal ROS (+)   Abdominal   Peds  Hematology negative hematology ROS (+)   Anesthesia Other Findings  Hgb 13.9, Plts 276  Reproductive/Obstetrics                            Anesthesia Physical Anesthesia Plan  ASA: 2  Anesthesia Plan: Spinal   Post-op Pain Management:  [facility] for Post-op pain   Induction:   PONV Risk Score and Plan: 2 and Propofol infusion, Treatment may vary due to age or medical condition, Ondansetron and TIVA  Airway Management Planned: Nasal Cannula and Simple Face Mask  Additional Equipment: None  Intra-op Plan:   Post-operative Plan:   Informed Consent: I have reviewed the patients History and Physical, chart, labs and discussed the procedure including the risks, benefits and alternatives for the proposed anesthesia with the patient or authorized representative who has indicated his/her understanding and acceptance.       Plan Discussed with:   Anesthesia Plan Comments:        Anesthesia Quick Evaluation

## 2020-08-10 ENCOUNTER — Encounter (HOSPITAL_COMMUNITY): Payer: Self-pay | Admitting: Orthopedic Surgery

## 2020-08-10 MED ORDER — BUPIVACAINE LIPOSOME 1.3 % IJ SUSP
20.0000 mL | Freq: Once | INTRAMUSCULAR | Status: DC
  Filled 2020-08-10: qty 20

## 2020-08-10 NOTE — H&P (Signed)
TOTAL KNEE ADMISSION H&P  Patient is being admitted for right total knee arthroplasty.  Subjective:  Chief Complaint: Right knee pain.  HPI: Morgan Hall, 75 y.o. female has a history of pain and functional disability in the right knee due to arthritis and has failed non-surgical conservative treatments for greater than 12 weeks to include NSAID's and/or analgesics and activity modification. Onset of symptoms was gradual, starting 5 years ago with gradually worsening course since that time. The patient noted no past surgery on the right knee.  Patient currently rates pain in the right knee at 5 out of 10 with activity. Patient has worsening of pain with activity and weight bearing and pain that interferes with activities of daily living. Patient has evidence of periarticular osteophytes and joint space narrowing by imaging studies. There is no active infection.  There are no problems to display for this patient.   Past Medical History:  Diagnosis Date   Anxiety    Diabetes mellitus without complication (HCC)    Heart murmur    High cholesterol    Hypertension     Past Surgical History:  Procedure Laterality Date   colonscopy      Prior to Admission medications   Medication Sig Start Date End Date Taking? Authorizing Provider  amLODipine (NORVASC) 10 MG tablet Take 10 mg by mouth daily. 05/27/20  Yes [provider]  carvedilol (COREG) 3.125 MG tablet Take 3.125 mg by mouth in the morning and at bedtime. 07/15/20  Yes [provider]  diclofenac Sodium (VOLTAREN) 1 % GEL Apply 2 g topically daily as needed (knee pain).   Yes [provider]  glipiZIDE (GLUCOTROL XL) 5 MG 24 hr tablet Take 5 mg by mouth at bedtime. 06/04/20  Yes [provider]  lisinopril (ZESTRIL) 30 MG tablet Take 30 mg by mouth daily. 05/29/20  Yes [provider]  metFORMIN (GLUCOPHAGE) 1000 MG tablet Take 1,000 mg by mouth every evening. 05/30/20  Yes [provider]  pravastatin (PRAVACHOL) 40 MG tablet Take 40 mg by mouth daily. 05/30/20  Yes [provider]  busPIRone (BUSPAR) 5 MG tablet Take 5 mg by mouth 2 (two) times daily.    [provider]    Allergies  Allergen Reactions   Penicillins     Allergic reaction when taken with robitussin but can take PCN alone   Robitussin (Alcohol Free) [Guaifenesin]     Allergic reaction when taken with PCN but can take robitussin alone    Social History   Socioeconomic History   Marital status: Married    Spouse name: Not on file   Number of children: Not on file   Years of education: Not on file   Highest education level: Not on file  Occupational History   Not on file  Tobacco Use   Smoking status: Never   Smokeless tobacco: Never  Vaping Use   Vaping Use: Never used  Substance and Sexual Activity   Alcohol use: Never   Drug use: Never   Sexual activity: Not on file  Other Topics Concern   Not on file  Social History Narrative   Not on file   Social Determinants of Health   Financial Resource Strain: Not on file  Food Insecurity: Not on file  Transportation Needs: Not on file  Physical Activity: Not on file  Stress: Not on file  Social Connections: Not on file  Intimate Partner Violence: Not on file    Tobacco Use: Low Risk  Smoking Tobacco Use: Never   Smokeless Tobacco Use: Never   Social History   Substance and Sexual Activity  Alcohol Use Never    History reviewed. No pertinent family history.  ROS: Constitutional: no fever, no chills, no night sweats, no significant weight loss Cardiovascular: no chest pain, no palpitations Respiratory: no cough, no shortness of breath, No COPD Gastrointestinal: no vomiting, no nausea Musculoskeletal: no swelling in Joints, Joint Pain Neurologic: no numbness, no tingling, no difficulty with balance   Objective:  Physical Exam: Well nourished and well developed.  General: Alert and oriented  x3, cooperative and pleasant, no acute distress.  Head: normocephalic, atraumatic, neck supple.  Eyes: EOMI.  Respiratory: breath sounds clear in all fields, no wheezing, rales, or rhonchi. Cardiovascular: Regular rate and rhythm, no murmurs, gallops or rubs.  Abdomen: non-tender to palpation and soft, normoactive bowel sounds. Musculoskeletal:  Significantly antalgic gait pattern with the use of a cane.    Right Knee Exam:  Moderate effusion.  Range of motion is 5 to 110 degrees.  Moderate crepitus on range of motion of the knee.  Medial greater than lateral joint line tenderness.  Stable knee.    Left Knee Exam:  No effusion.  Range of motion is 5 to 125 degrees.  Moderate crepitus on range of motion of the knee.  Slight medial joint line tenderness.  No lateral joint line tenderness.  Stable knee.    Sensation and motor function intact in LE. Distal pulses 2+. Calves soft and nontender.        Vital signs in last 24 hours:    Imaging Review bilateral AP and lateral of the right knee demonstrate bone-on-bone arthritis in the medial and patellofemoral compartments of the right knee. She has near bone-on-bone arthritis in the medial compartment of the left knee.   Assessment/Plan:  End stage arthritis, right knee   The patient history, physical examination, clinical judgment of the provider and imaging studies are consistent with end stage degenerative joint disease of the right knee and total knee arthroplasty is deemed medically necessary. The treatment options including medical management, injection therapy arthroscopy and arthroplasty were discussed at length. The risks and benefits of total knee arthroplasty were presented and reviewed. The risks due to aseptic loosening, infection, stiffness, patella tracking problems, thromboembolic complications and other imponderables were discussed. The patient acknowledged the explanation, agreed to proceed with the plan and consent  was signed. Patient is being admitted for inpatient treatment for surgery, pain control, PT, OT, prophylactic antibiotics, VTE prophylaxis, progressive ambulation and ADLs and discharge planning. The patient is planning to be discharged  home .   Patient's anticipated LOS is less than 2 midnights, meeting these requirements: - Lives within 1 hour of care - Has a competent adult at home to recover with post-op recovery - NO history of  - Chronic pain requiring opioids  - Diabetes  - Coronary Artery Disease  - Heart failure  - Heart attack  - Stroke  - DVT/VTE  - Cardiac arrhythmia  - Respiratory Failure/COPD  - Renal failure  - Anemia  - Advanced Liver disease  Therapy Plans: [place] [facility] Disposition: Home with daughter Planned DVT Prophylaxis: Xarelto 10mg  (hx of Aspirin related ulcers with full strength) DME Needed: PCP: Dan Humphreys, PA-C - Clearance received, labs received TXA: IV Allergies: Guaifenesin, PCN G Anesthesia Concerns: None BMI: 32 Last HgbA1c: 6.8  Pharmacy: [facility] in [place]; [facility] on [address] if Emergent  Other: Patient requesting anxiety medicine prior to procedure. Would also like for her daughter to stay with her while in the hospital -- advised patient to discuss with hospital at preop appt.     - Patient was instructed on what medications to stop prior to surgery. - Follow-up visit in 2 weeks with Dr. Lequita Halt - Begin physical therapy following surgery - Pre-operative lab work as pre-surgical testing - Prescriptions will be provided in hospital at time of discharge  Nelia Shi, [facility], PA-C Orthopedic Surgery [facility] [place]

## 2020-08-11 ENCOUNTER — Ambulatory Visit (HOSPITAL_COMMUNITY)
Admission: RE | Admit: 2020-08-11 | Discharge: 2020-08-12 | Disposition: A | Discharge status: Home / Self Care | Payer: PPO | Attending: Orthopedic Surgery | Admitting: Orthopedic Surgery

## 2020-08-11 ENCOUNTER — Ambulatory Visit (HOSPITAL_COMMUNITY): Payer: PPO | Admitting: Anesthesiology

## 2020-08-11 ENCOUNTER — Other Ambulatory Visit: Payer: Self-pay

## 2020-08-11 ENCOUNTER — Encounter (HOSPITAL_COMMUNITY): Payer: Self-pay | Admitting: Orthopedic Surgery

## 2020-08-11 ENCOUNTER — Encounter (HOSPITAL_COMMUNITY)
Admission: RE | Disposition: A | Discharge status: Home / Self Care | Payer: Self-pay | Source: Home / Self Care | Attending: Orthopedic Surgery

## 2020-08-11 DIAGNOSIS — F419 Anxiety disorder, unspecified: Secondary | ICD-10-CM | POA: Diagnosis not present

## 2020-08-11 DIAGNOSIS — M1711 Unilateral primary osteoarthritis, right knee: Secondary | ICD-10-CM | POA: Diagnosis not present

## 2020-08-11 DIAGNOSIS — G8918 Other acute postprocedural pain: Secondary | ICD-10-CM | POA: Diagnosis not present

## 2020-08-11 DIAGNOSIS — E119 Type 2 diabetes mellitus without complications: Secondary | ICD-10-CM | POA: Diagnosis not present

## 2020-08-11 DIAGNOSIS — M179 Osteoarthritis of knee, unspecified: Secondary | ICD-10-CM | POA: Diagnosis present

## 2020-08-11 DIAGNOSIS — Z880 Allergy status to penicillin: Secondary | ICD-10-CM | POA: Diagnosis not present

## 2020-08-11 DIAGNOSIS — I10 Essential (primary) hypertension: Secondary | ICD-10-CM | POA: Diagnosis not present

## 2020-08-11 DIAGNOSIS — M1710 Unilateral primary osteoarthritis, unspecified knee: Secondary | ICD-10-CM | POA: Diagnosis present

## 2020-08-11 DIAGNOSIS — Z7984 Long term (current) use of oral hypoglycemic drugs: Secondary | ICD-10-CM | POA: Diagnosis not present

## 2020-08-11 DIAGNOSIS — E7800 Pure hypercholesterolemia, unspecified: Secondary | ICD-10-CM | POA: Diagnosis not present

## 2020-08-11 HISTORY — PX: TOTAL KNEE ARTHROPLASTY: SHX125

## 2020-08-11 LAB — GLUCOSE, CAPILLARY
Glucose-Capillary: 153 mg/dL — ABNORMAL HIGH (ref 70–99)
Glucose-Capillary: 151 mg/dL — ABNORMAL HIGH (ref 70–99)
Glucose-Capillary: 193 mg/dL — ABNORMAL HIGH (ref 70–99)
Glucose-Capillary: 264 mg/dL — ABNORMAL HIGH (ref 70–99)
Glucose-Capillary: 275 mg/dL — ABNORMAL HIGH (ref 70–99)

## 2020-08-11 LAB — TYPE AND SCREEN
ABO/RH(D): O POS
Antibody Screen: NEGATIVE

## 2020-08-11 LAB — ABO/RH: ABO/RH(D): O POS

## 2020-08-11 SURGERY — ARTHROPLASTY, KNEE, TOTAL
Anesthesia: Spinal | Site: Knee | Laterality: Right

## 2020-08-11 MED ORDER — SODIUM CHLORIDE 0.9 % IV SOLN: INTRAVENOUS | Status: DC

## 2020-08-11 MED ORDER — SODIUM CHLORIDE (PF) 0.9 % IJ SOLN
INTRAMUSCULAR | Status: AC
  Filled 2020-08-11: qty 10

## 2020-08-11 MED ORDER — ROPIVACAINE HCL 5 MG/ML IJ SOLN
INTRAMUSCULAR | Status: DC | PRN
  Administered 2020-08-11: 30 mL via PERINEURAL

## 2020-08-11 MED ORDER — RIVAROXABAN 10 MG PO TABS
10.0000 mg | ORAL_TABLET | Freq: Every day | ORAL | Status: DC
  Administered 2020-08-12: 10 mg via ORAL
  Filled 2020-08-11: qty 1

## 2020-08-11 MED ORDER — GLIPIZIDE ER 5 MG PO TB24
5.0000 mg | ORAL_TABLET | Freq: Every day | ORAL | Status: DC
  Administered 2020-08-11: 5 mg via ORAL
  Filled 2020-08-11: qty 1

## 2020-08-11 MED ORDER — ONDANSETRON HCL 4 MG/2ML IJ SOLN
INTRAMUSCULAR | Status: AC
  Filled 2020-08-11: qty 2

## 2020-08-11 MED ORDER — DEXAMETHASONE SODIUM PHOSPHATE 10 MG/ML IJ SOLN
10.0000 mg | Freq: Once | INTRAMUSCULAR | Status: AC
  Administered 2020-08-12: 10 mg via INTRAVENOUS
  Filled 2020-08-11: qty 1

## 2020-08-11 MED ORDER — METHOCARBAMOL 500 MG PO TABS
500.0000 mg | ORAL_TABLET | Freq: Four times a day (QID) | ORAL | Status: DC | PRN
  Administered 2020-08-11 – 2020-08-12 (×2): 500 mg via ORAL
  Filled 2020-08-11 (×2): qty 1

## 2020-08-11 MED ORDER — SODIUM CHLORIDE (PF) 0.9 % IJ SOLN
INTRAMUSCULAR | Status: DC | PRN
  Administered 2020-08-11: 60 mL

## 2020-08-11 MED ORDER — CARVEDILOL 3.125 MG PO TABS
3.1250 mg | ORAL_TABLET | Freq: Two times a day (BID) | ORAL | Status: DC
  Administered 2020-08-11 – 2020-08-12 (×2): 3.125 mg via ORAL
  Filled 2020-08-11 (×2): qty 1

## 2020-08-11 MED ORDER — EPHEDRINE 5 MG/ML INJ
INTRAVENOUS | Status: AC
  Filled 2020-08-11: qty 10

## 2020-08-11 MED ORDER — MENTHOL 3 MG MT LOZG: 1.0000 | LOZENGE | OROMUCOSAL | Status: DC | PRN

## 2020-08-11 MED ORDER — LACTATED RINGERS IV SOLN: INTRAVENOUS | Status: DC

## 2020-08-11 MED ORDER — TRAMADOL HCL 50 MG PO TABS
50.0000 mg | ORAL_TABLET | Freq: Four times a day (QID) | ORAL | Status: DC | PRN
Start: 2020-08-11 — End: 2020-08-12
  Administered 2020-08-11 – 2020-08-12 (×2): 100 mg via ORAL
  Filled 2020-08-11 (×2): qty 2

## 2020-08-11 MED ORDER — PROPOFOL 10 MG/ML IV BOLUS
INTRAVENOUS | Status: DC | PRN
  Administered 2020-08-11: 20 mg via INTRAVENOUS

## 2020-08-11 MED ORDER — METOCLOPRAMIDE HCL 5 MG/ML IJ SOLN: 5.0000 mg | Freq: Three times a day (TID) | INTRAMUSCULAR | Status: DC | PRN

## 2020-08-11 MED ORDER — DEXAMETHASONE SODIUM PHOSPHATE 10 MG/ML IJ SOLN
INTRAMUSCULAR | Status: AC
  Filled 2020-08-11: qty 1

## 2020-08-11 MED ORDER — OXYCODONE HCL 5 MG/5ML PO SOLN: 5.0000 mg | Freq: Once | ORAL | Status: DC | PRN

## 2020-08-11 MED ORDER — TRANEXAMIC ACID-NACL 1000-0.7 MG/100ML-% IV SOLN
1000.0000 mg | INTRAVENOUS | Status: AC
  Administered 2020-08-11: 1000 mg via INTRAVENOUS
  Filled 2020-08-11: qty 100

## 2020-08-11 MED ORDER — POLYETHYLENE GLYCOL 3350 17 G PO PACK: 17.0000 g | PACK | Freq: Every day | ORAL | Status: DC | PRN

## 2020-08-11 MED ORDER — PROPOFOL 500 MG/50ML IV EMUL
INTRAVENOUS | Status: DC | PRN
  Administered 2020-08-11: 75 ug/kg/min via INTRAVENOUS

## 2020-08-11 MED ORDER — ONDANSETRON HCL 4 MG/2ML IJ SOLN
INTRAMUSCULAR | Status: DC | PRN
  Administered 2020-08-11: 4 mg via INTRAVENOUS

## 2020-08-11 MED ORDER — PHENOL 1.4 % MT LIQD: 1.0000 | OROMUCOSAL | Status: DC | PRN

## 2020-08-11 MED ORDER — EPHEDRINE SULFATE-NACL 50-0.9 MG/10ML-% IV SOSY
PREFILLED_SYRINGE | INTRAVENOUS | Status: DC | PRN
  Administered 2020-08-11 (×3): 10 mg via INTRAVENOUS

## 2020-08-11 MED ORDER — AMLODIPINE BESYLATE 10 MG PO TABS
10.0000 mg | ORAL_TABLET | Freq: Every day | ORAL | Status: DC
  Administered 2020-08-12: 10 mg via ORAL
  Filled 2020-08-11: qty 1

## 2020-08-11 MED ORDER — ONDANSETRON HCL 4 MG/2ML IJ SOLN: 4.0000 mg | Freq: Once | INTRAMUSCULAR | Status: DC | PRN

## 2020-08-11 MED ORDER — SODIUM CHLORIDE 0.9 % IR SOLN
Status: DC | PRN
  Administered 2020-08-11 (×2): 1000 mL

## 2020-08-11 MED ORDER — CHLORHEXIDINE GLUCONATE 0.12 % MT SOLN
15.0000 mL | Freq: Once | OROMUCOSAL | Status: AC
  Administered 2020-08-11: 15 mL via OROMUCOSAL

## 2020-08-11 MED ORDER — METHOCARBAMOL 500 MG IVPB - SIMPLE MED
500.0000 mg | Freq: Four times a day (QID) | INTRAVENOUS | Status: DC | PRN
  Filled 2020-08-11: qty 50

## 2020-08-11 MED ORDER — POVIDONE-IODINE 10 % EX SWAB
2.0000 "application " | Freq: Once | CUTANEOUS | Status: AC
  Administered 2020-08-11: 2 via TOPICAL

## 2020-08-11 MED ORDER — DIPHENHYDRAMINE HCL 12.5 MG/5ML PO ELIX: 12.5000 mg | ORAL_SOLUTION | ORAL | Status: DC | PRN

## 2020-08-11 MED ORDER — FENTANYL CITRATE (PF) 100 MCG/2ML IJ SOLN: 25.0000 ug | INTRAMUSCULAR | Status: DC | PRN

## 2020-08-11 MED ORDER — MEPIVACAINE HCL (PF) 2 % IJ SOLN
INTRAMUSCULAR | Status: DC | PRN
  Administered 2020-08-11: 60 mg via INTRATHECAL

## 2020-08-11 MED ORDER — AMISULPRIDE (ANTIEMETIC) 5 MG/2ML IV SOLN: 10.0000 mg | Freq: Once | INTRAVENOUS | Status: DC | PRN

## 2020-08-11 MED ORDER — HYDROMORPHONE HCL 1 MG/ML IJ SOLN: 0.5000 mg | INTRAMUSCULAR | Status: DC | PRN

## 2020-08-11 MED ORDER — OXYCODONE HCL 5 MG PO TABS: 5.0000 mg | ORAL_TABLET | Freq: Once | ORAL | Status: DC | PRN

## 2020-08-11 MED ORDER — BISACODYL 10 MG RE SUPP: 10.0000 mg | Freq: Every day | RECTAL | Status: DC | PRN

## 2020-08-11 MED ORDER — BUPIVACAINE LIPOSOME 1.3 % IJ SUSP
INTRAMUSCULAR | Status: DC | PRN
  Administered 2020-08-11: 20 mL

## 2020-08-11 MED ORDER — DEXAMETHASONE SODIUM PHOSPHATE 10 MG/ML IJ SOLN
8.0000 mg | Freq: Once | INTRAMUSCULAR | Status: AC
  Administered 2020-08-11: 8 mg via INTRAVENOUS

## 2020-08-11 MED ORDER — GABAPENTIN 100 MG PO CAPS
100.0000 mg | ORAL_CAPSULE | Freq: Three times a day (TID) | ORAL | Status: DC
  Administered 2020-08-11 – 2020-08-12 (×4): 100 mg via ORAL
  Filled 2020-08-11 (×4): qty 1

## 2020-08-11 MED ORDER — DOCUSATE SODIUM 100 MG PO CAPS
100.0000 mg | ORAL_CAPSULE | Freq: Two times a day (BID) | ORAL | Status: DC
  Administered 2020-08-11 – 2020-08-12 (×2): 100 mg via ORAL
  Filled 2020-08-11 (×2): qty 1

## 2020-08-11 MED ORDER — ORAL CARE MOUTH RINSE: 15.0000 mL | Freq: Once | OROMUCOSAL | Status: AC

## 2020-08-11 MED ORDER — FENTANYL CITRATE (PF) 100 MCG/2ML IJ SOLN
INTRAMUSCULAR | Status: AC
  Filled 2020-08-11: qty 2

## 2020-08-11 MED ORDER — BUSPIRONE HCL 5 MG PO TABS
5.0000 mg | ORAL_TABLET | Freq: Two times a day (BID) | ORAL | Status: DC
Start: 2020-08-11 — End: 2020-08-12
  Administered 2020-08-11 – 2020-08-12 (×2): 5 mg via ORAL
  Filled 2020-08-11 (×2): qty 1

## 2020-08-11 MED ORDER — CEFAZOLIN SODIUM-DEXTROSE 2-4 GM/100ML-% IV SOLN
2.0000 g | INTRAVENOUS | Status: AC
  Administered 2020-08-11: 2 g via INTRAVENOUS
  Filled 2020-08-11: qty 100

## 2020-08-11 MED ORDER — PRAVASTATIN SODIUM 20 MG PO TABS
40.0000 mg | ORAL_TABLET | Freq: Every day | ORAL | Status: DC
  Administered 2020-08-12: 40 mg via ORAL
  Filled 2020-08-11: qty 2

## 2020-08-11 MED ORDER — ONDANSETRON HCL 4 MG/2ML IJ SOLN: 4.0000 mg | Freq: Four times a day (QID) | INTRAMUSCULAR | Status: DC | PRN

## 2020-08-11 MED ORDER — MIDAZOLAM HCL 2 MG/2ML IJ SOLN
INTRAMUSCULAR | Status: AC
  Filled 2020-08-11: qty 2

## 2020-08-11 MED ORDER — ONDANSETRON HCL 4 MG PO TABS: 4.0000 mg | ORAL_TABLET | Freq: Four times a day (QID) | ORAL | Status: DC | PRN

## 2020-08-11 MED ORDER — INSULIN ASPART 100 UNIT/ML IJ SOLN
0.0000 [IU] | Freq: Three times a day (TID) | INTRAMUSCULAR | Status: DC
  Administered 2020-08-11: 8 [IU] via SUBCUTANEOUS
  Administered 2020-08-11: 3 [IU] via SUBCUTANEOUS
  Administered 2020-08-12: 2 [IU] via SUBCUTANEOUS

## 2020-08-11 MED ORDER — MIDAZOLAM HCL 5 MG/5ML IJ SOLN
INTRAMUSCULAR | Status: DC | PRN
  Administered 2020-08-11 (×2): 1 mg via INTRAVENOUS

## 2020-08-11 MED ORDER — ACETAMINOPHEN 10 MG/ML IV SOLN
1000.0000 mg | Freq: Four times a day (QID) | INTRAVENOUS | Status: DC
  Administered 2020-08-11: 1000 mg via INTRAVENOUS
  Filled 2020-08-11: qty 100

## 2020-08-11 MED ORDER — OXYCODONE HCL 5 MG PO TABS
5.0000 mg | ORAL_TABLET | ORAL | Status: DC | PRN
  Administered 2020-08-11 (×2): 5 mg via ORAL
  Administered 2020-08-11: 10 mg via ORAL
  Filled 2020-08-11 (×2): qty 1
  Filled 2020-08-11: qty 2

## 2020-08-11 MED ORDER — MEPIVACAINE HCL (PF) 2 % IJ SOLN
INTRAMUSCULAR | Status: AC
  Filled 2020-08-11: qty 20

## 2020-08-11 MED ORDER — FENTANYL CITRATE (PF) 100 MCG/2ML IJ SOLN
INTRAMUSCULAR | Status: DC | PRN
  Administered 2020-08-11: 50 ug via INTRAVENOUS
  Administered 2020-08-11 (×2): 25 ug via INTRAVENOUS

## 2020-08-11 MED ORDER — FLEET ENEMA 7-19 GM/118ML RE ENEM: 1.0000 | ENEMA | Freq: Once | RECTAL | Status: DC | PRN

## 2020-08-11 MED ORDER — ACETAMINOPHEN 500 MG PO TABS
1000.0000 mg | ORAL_TABLET | Freq: Four times a day (QID) | ORAL | Status: AC
  Administered 2020-08-11 – 2020-08-12 (×4): 1000 mg via ORAL
  Filled 2020-08-11 (×4): qty 2

## 2020-08-11 MED ORDER — METOCLOPRAMIDE HCL 5 MG PO TABS: 5.0000 mg | ORAL_TABLET | Freq: Three times a day (TID) | ORAL | Status: DC | PRN

## 2020-08-11 MED ORDER — CEFAZOLIN SODIUM-DEXTROSE 2-4 GM/100ML-% IV SOLN
2.0000 g | Freq: Four times a day (QID) | INTRAVENOUS | Status: AC
Start: 2020-08-11 — End: 2020-08-11
  Administered 2020-08-11 (×2): 2 g via INTRAVENOUS
  Filled 2020-08-11 (×2): qty 100

## 2020-08-11 SURGICAL SUPPLY — 58 items
ATTUNE PS FEM RT SZ 4 CEM KNEE (Femur) ×3 IMPLANT
ATTUNE PSRP INSR SZ4 8 KNEE (Insert) ×2 IMPLANT
ATTUNE PSRP INSR SZ4 8MM KNEE (Insert) ×1 IMPLANT
BAG DECANTER FOR FLEXI CONT (MISCELLANEOUS) ×3 IMPLANT
BAG SPEC THK2 15X12 ZIP CLS (MISCELLANEOUS) ×1
BAG ZIPLOCK 12X15 (MISCELLANEOUS) ×3 IMPLANT
BASE TIBIAL ROT PLAT SZ 3 KNEE (Knees) ×1 IMPLANT
BLADE SAG 18X100X1.27 (BLADE) ×3 IMPLANT
BLADE SAW SGTL 11.0X1.19X90.0M (BLADE) ×3 IMPLANT
BNDG ELASTIC 6X5.8 VLCR STR LF (GAUZE/BANDAGES/DRESSINGS) ×3 IMPLANT
BOWL SMART MIX CTS (DISPOSABLE) ×3 IMPLANT
BSPLAT TIB 3 CMNT ROT PLAT STR (Knees) ×1 IMPLANT
CEMENT HV SMART SET (Cement) ×6 IMPLANT
CLOSURE WOUND 1/2 X4 (GAUZE/BANDAGES/DRESSINGS) ×2
COVER SURGICAL LIGHT HANDLE (MISCELLANEOUS) ×3 IMPLANT
COVER WAND RF STERILE (DRAPES) IMPLANT
CUFF TOURN SGL QUICK 34 (TOURNIQUET CUFF) ×3
CUFF TRNQT CYL 34X4.125X (TOURNIQUET CUFF) ×1 IMPLANT
DECANTER SPIKE VIAL GLASS SM (MISCELLANEOUS) IMPLANT
DRAPE U-SHAPE 47X51 STRL (DRAPES) ×3 IMPLANT
DRSG AQUACEL AG ADV 3.5X10 (GAUZE/BANDAGES/DRESSINGS) ×3 IMPLANT
DURAPREP 26ML APPLICATOR (WOUND CARE) ×3 IMPLANT
ELECT REM PT RETURN 15FT ADLT (MISCELLANEOUS) ×3 IMPLANT
GLOVE SRG 8 PF TXTR STRL LF DI (GLOVE) ×1 IMPLANT
GLOVE SURG ENC MOIS LTX SZ6.5 (GLOVE) ×3 IMPLANT
GLOVE SURG ENC MOIS LTX SZ8 (GLOVE) ×6 IMPLANT
GLOVE SURG UNDER POLY LF SZ7 (GLOVE) ×3 IMPLANT
GLOVE SURG UNDER POLY LF SZ8 (GLOVE) ×3
GLOVE SURG UNDER POLY LF SZ8.5 (GLOVE) ×3 IMPLANT
GOWN STRL REUS W/TWL LRG LVL3 (GOWN DISPOSABLE) ×6 IMPLANT
GOWN STRL REUS W/TWL XL LVL3 (GOWN DISPOSABLE) ×3 IMPLANT
HANDPIECE INTERPULSE COAX TIP (DISPOSABLE) ×3
HOLDER FOLEY CATH W/STRAP (MISCELLANEOUS) ×3 IMPLANT
IMMOBILIZER KNEE 20 (SOFTGOODS) ×3
IMMOBILIZER KNEE 20 THIGH 36 (SOFTGOODS) ×1 IMPLANT
KIT TURNOVER KIT A (KITS) ×3 IMPLANT
MANIFOLD NEPTUNE II (INSTRUMENTS) ×3 IMPLANT
NS IRRIG 1000ML POUR BTL (IV SOLUTION) ×3 IMPLANT
PACK TOTAL KNEE CUSTOM (KITS) ×3 IMPLANT
PADDING CAST COTTON 6X4 STRL (CAST SUPPLIES) ×3 IMPLANT
PATELLA MEDIAL ATTUN 35MM KNEE (Knees) ×3 IMPLANT
PENCIL SMOKE EVACUATOR (MISCELLANEOUS) ×3 IMPLANT
PIN DRILL FIX HALF THREAD (BIT) ×3 IMPLANT
PIN STEINMAN FIXATION KNEE (PIN) ×3 IMPLANT
PROTECTOR NERVE ULNAR (MISCELLANEOUS) ×3 IMPLANT
SET HNDPC FAN SPRY TIP SCT (DISPOSABLE) ×1 IMPLANT
STRIP CLOSURE SKIN 1/2X4 (GAUZE/BANDAGES/DRESSINGS) ×4 IMPLANT
SUT MNCRL AB 4-0 PS2 18 (SUTURE) ×3 IMPLANT
SUT STRATAFIX 0 PDS 27 VIOLET (SUTURE) ×3
SUT VIC AB 2-0 CT1 27 (SUTURE) ×9
SUT VIC AB 2-0 CT1 TAPERPNT 27 (SUTURE) ×3 IMPLANT
SUTURE STRATFX 0 PDS 27 VIOLET (SUTURE) ×1 IMPLANT
TIBIAL BASE ROT PLAT SZ 3 KNEE (Knees) ×3 IMPLANT
TRAY FOLEY MTR SLVR 14FR STAT (SET/KITS/TRAYS/PACK) ×3 IMPLANT
TRAY FOLEY MTR SLVR 16FR STAT (SET/KITS/TRAYS/PACK) IMPLANT
TUBE SUCTION HIGH CAP CLEAR NV (SUCTIONS) ×3 IMPLANT
WATER STERILE IRR 1000ML POUR (IV SOLUTION) ×6 IMPLANT
WRAP KNEE MAXI GEL POST OP (GAUZE/BANDAGES/DRESSINGS) ×3 IMPLANT

## 2020-08-11 NOTE — Op Note (Signed)
OPERATIVE REPORT-TOTAL KNEE ARTHROPLASTY   Pre-operative diagnosis- Osteoarthritis  Right knee(s)  Post-operative diagnosis- Osteoarthritis Right knee(s)  Procedure-  Right  Total Knee Arthroplasty  Surgeon- Gus Rankin. Tinnie Kunin, MD  Assistant- Arther Abbott, PA-C   Anesthesia-   Adductor canal block and spinal  EBL-25 ml   Drains None  Tourniquet time-  Total Tourniquet Time Documented: Thigh (Right) - 37 minutes Total: Thigh (Right) - 37 minutes     Complications- None  Condition-PACU - hemodynamically stable.   Brief Clinical Note  Morgan Hall is a 75 y.o. year old female with end stage OA of her right knee with progressively worsening pain and dysfunction. She has constant pain, with activity and at rest and significant functional deficits with difficulties even with ADLs. She has had extensive non-op management including analgesics, injections of cortisone and viscosupplements, and home exercise program, but remains in significant pain with significant dysfunction.Radiographs show bone on bone arthritis medial and patellofemoral. She presents now for right Total Knee Arthroplasty.     Procedure in detail---   The patient is brought into the operating room and positioned supine on the operating table. After successful administration of  Adductor canal block and spinal,   a tourniquet is placed high on the  Right thigh(s) and the lower extremity is prepped and draped in the usual sterile fashion. Time out is performed by the operating team and then the  Right lower extremity is wrapped in Esmarch, knee flexed and the tourniquet inflated to 300 mmHg.       A midline incision is made with a ten blade through the subcutaneous tissue to the level of the extensor mechanism. A fresh blade is used to make a medial parapatellar arthrotomy. Soft tissue over the proximal medial tibia is subperiosteally elevated to the joint line with a knife and into the semimembranosus bursa  with a Cobb elevator. Soft tissue over the proximal lateral tibia is elevated with attention being paid to avoiding the patellar tendon on the tibial tubercle. The patella is everted, knee flexed 90 degrees and the ACL and PCL are removed. Findings are bone on bone  medial and patellofemoral with large global osteophytes.      The drill is used to create a starting hole in the distal femur and the canal is thoroughly irrigated with sterile saline to remove the fatty contents. The 5 degree Right  valgus alignment guide is placed into the femoral canal and the distal femoral cutting block is pinned to remove 9 mm off the distal femur. Resection is made with an oscillating saw.      The tibia is subluxed forward and the menisci are removed. The extramedullary alignment guide is placed referencing proximally at the medial aspect of the tibial tubercle and distally along the second metatarsal axis and tibial crest. The block is pinned to remove 60mm off the more deficient medial  side. Resection is made with an oscillating saw. Size 3is the most appropriate size for the tibia and the proximal tibia is prepared with the modular drill and keel punch for that size.      The femoral sizing guide is placed and size 4 is most appropriate. Rotation is marked off the epicondylar axis and confirmed by creating a rectangular flexion gap at 90 degrees. The size 4 cutting block is pinned in this rotation and the anterior, posterior and chamfer cuts are made with the oscillating saw. The intercondylar block is then placed and that cut is made.  Trial size 3 tibial component, trial size 4 posterior stabilized femur and a 8  mm posterior stabilized rotating platform insert trial is placed. Full extension is achieved with excellent varus/valgus and anterior/posterior balance throughout full range of motion. The patella is everted and thickness measured to be 21  mm. Free hand resection is taken to 12 mm, a 35 template is placed,  lug holes are drilled, trial patella is placed, and it tracks normally. Osteophytes are removed off the posterior femur with the trial in place. All trials are removed and the cut bone surfaces prepared with pulsatile lavage. Cement is mixed and once ready for implantation, the size 3 tibial implant, size  4 posterior stabilized femoral component, and the size 35 patella are cemented in place and the patella is held with the clamp. The trial insert is placed and the knee held in full extension. The Exparel (20 ml mixed with 60 ml saline) is injected into the extensor mechanism, posterior capsule, medial and lateral gutters and subcutaneous tissues.  All extruded cement is removed and once the cement is hard the permanent 8 mm posterior stabilized rotating platform insert is placed into the tibial tray.      The wound is copiously irrigated with saline solution and the extensor mechanism closed with # 0 Stratofix suture. The tourniquet is released for a total tourniquet time of 36  minutes. Flexion against gravity is 140 degrees and the patella tracks normally. Subcutaneous tissue is closed with 2.0 vicryl and subcuticular with running 4.0 Monocryl. The incision is cleaned and dried and steri-strips and a bulky sterile dressing are applied. The limb is placed into a knee immobilizer and the patient is awakened and transported to recovery in stable condition.      Please note that a surgical assistant was a medical necessity for this procedure in order to perform it in a safe and expeditious manner. Surgical assistant was necessary to retract the ligaments and vital neurovascular structures to prevent injury to them and also necessary for proper positioning of the limb to allow for anatomic placement of the prosthesis.   Gus Rankin Kenza Munar, MD    08/11/2020, 8:23 AM

## 2020-08-11 NOTE — Anesthesia Postprocedure Evaluation (Signed)
Anesthesia Post Note  Patient: Morgan Hall  Procedure(s) Performed: TOTAL KNEE ARTHROPLASTY (Right: Knee)     Patient location during evaluation: PACU Anesthesia Type: Spinal Level of consciousness: oriented and awake and alert Pain management: pain level controlled Vital Signs Assessment: post-procedure vital signs reviewed and stable Respiratory status: spontaneous breathing, respiratory function stable and nonlabored ventilation Cardiovascular status: blood pressure returned to baseline and stable Postop Assessment: no headache, no backache, no apparent nausea or vomiting and spinal receding Anesthetic complications: no   No notable events documented.  Last Vitals:  Vitals:   08/11/20 1000 08/11/20 1015  BP: (!) 141/67 138/67  Pulse: 78 79  Resp: 20 16  Temp:  36.4 C  SpO2: 99% 99%    Last Pain:  Vitals:   08/11/20 1015  TempSrc:   PainSc: 0-No pain                 Lucretia Kern

## 2020-08-11 NOTE — Progress Notes (Signed)
Orthopedic Tech Progress Note Patient Details:  Morgan Hall 20-Mar-1945 [id number]   CPM Right Knee CPM Right Knee: On Right Knee Flexion (Degrees): 10 Right Knee Extension (Degrees): 40  Post Interventions Patient Tolerated: Well Instructions Provided: Care of device, Adjustment of device  Shalena Ezzell [name] 08/11/2020, 9:53 AM

## 2020-08-11 NOTE — Progress Notes (Signed)
Orthopedic Tech Progress Note Patient Details:  Morgan Hall [birth year] [id number]   CPM Right Knee CPM Right Knee: Off Right Knee Flexion (Degrees): 10 Right Knee Extension (Degrees): 40 Additional Comments: Remained in CPM for full 4 hours with no complaints.  Post Interventions Patient Tolerated: Well Instructions Provided: Care of device, Adjustment of device  Dondra Rhett [name] 08/11/2020, 3:32 PM

## 2020-08-11 NOTE — Transfer of Care (Signed)
Immediate Anesthesia Transfer of Care Note  Patient: [place]  Procedure(s) Performed: TOTAL KNEE ARTHROPLASTY (Right: Knee)  Patient Location: PACU  Anesthesia Type:Regional and Spinal  Level of Consciousness: awake, alert  and oriented  Airway & Oxygen Therapy: Patient Spontanous Breathing and Patient connected to face mask oxygen  Post-op Assessment: Report given to RN and Post -op Vital signs reviewed and stable  Post vital signs: Reviewed and stable  Last Vitals:  Vitals Value Taken Time  BP 121/57 08/11/20 0849  Temp    Pulse 71 08/11/20 0850  Resp 15 08/11/20 0850  SpO2 98 % 08/11/20 0850  Vitals shown include unvalidated device data.  Last Pain:  Vitals:   08/11/20 0554  TempSrc:   PainSc: 0-No pain      Patients Stated Pain Goal: 4 (08/11/20 0554)  Complications: No notable events documented.

## 2020-08-11 NOTE — Anesthesia Procedure Notes (Signed)
Anesthesia Regional Block: Adductor canal block   Pre-Anesthetic Checklist: , timeout performed,  Correct Patient, Correct Site, Correct Laterality,  Correct Procedure, Correct Position, site marked,  Risks and benefits discussed,  Surgical consent,  Pre-op evaluation,  At surgeon's request and post-op pain management  Laterality: Right  Prep: chloraprep       Needles:  Injection technique: Single-shot  Needle Type: Echogenic Stimulator Needle     Needle Length: 10cm  Needle Gauge: 20     Additional Needles:   Procedures:,,,, ultrasound used (permanent image in chart),,    Narrative:  Start time: 08/11/2020 7:04 AM End time: 08/11/2020 7:07 AM Injection made incrementally with aspirations every 5 mL.  Performed by: Personally  Anesthesiologist: Lucretia Kern, MD  Additional Notes: Standard monitors applied. Skin prepped. Good needle visualization with ultrasound. Injection made in 5cc increments with no resistance to injection. Patient tolerated the procedure well.

## 2020-08-11 NOTE — Discharge Instructions (Addendum)
 Frank Aluisio, MD Total Joint Specialist [facility] [address] [place], [place] [address] [phone]  TOTAL KNEE REPLACEMENT POSTOPERATIVE DIRECTIONS    Knee Rehabilitation, Guidelines Following Surgery  Results after knee surgery are often greatly improved when you follow the exercise, range of motion and muscle strengthening exercises prescribed by your doctor. Safety measures are also important to protect the knee from further injury. If any of these exercises cause you to have increased pain or swelling in your knee joint, decrease the amount until you are comfortable again and slowly increase them. If you have problems or questions, call your caregiver or physical therapist for advice.    BLOOD CLOT PREVENTION Take a 10 mg Xarelto once a day for three weeks following surgery. Then take an 81 mg Aspirin once a day for three weeks. Then discontinue Aspirin. You may resume your vitamins/supplements once you have discontinued the Xarelto. Do not take any NSAIDs (Advil, Aleve, Ibuprofen, Meloxicam, etc.) until you have discontinued the Xarelto.    HOME CARE INSTRUCTIONS  Remove items at home which could result in a fall. This includes throw rugs or furniture in walking pathways.  ICE to the affected knee as much as tolerated. Icing helps control swelling. If the swelling is well controlled you will be more comfortable and rehab easier. Continue to use ice on the knee for pain and swelling from surgery. You may notice swelling that will progress down to the foot and ankle. This is normal after surgery. Elevate the leg when you are not up walking on it.    Continue to use the breathing machine which will help keep your temperature down. It is common for your temperature to cycle up and down following surgery, especially at night when you are not up moving around and exerting yourself. The breathing machine keeps your lungs expanded and your temperature  down. Do not place pillow under the operative knee, focus on keeping the knee straight while resting  DIET You may resume your previous home diet once you are discharged from the hospital.  DRESSING / WOUND CARE / SHOWERING Keep your bulky bandage on for 2 days. On the third post-operative day you may remove the Ace bandage and gauze. There is a waterproof adhesive bandage on your skin which will stay in place until your first follow-up appointment. Once you remove this you will not need to place another bandage You may begin showering 3 days following surgery, but do not submerge the incision under water.  ACTIVITY For the first 5 days, the key is rest and control of pain and swelling Do your home exercises twice a day starting on post-operative day 3. On the days you go to physical therapy, just do the home exercises once that day. You should rest, ice and elevate the leg for 50 minutes out of every hour. Get up and walk/stretch for 10 minutes per hour. After 5 days you can increase your activity slowly as tolerated. Walk with your walker as instructed. Use the walker until you are comfortable transitioning to a cane. Walk with the cane in the opposite hand of the operative leg. You may discontinue the cane once you are comfortable and walking steadily. Avoid periods of inactivity such as sitting longer than an hour when not asleep. This helps prevent blood clots.  You may discontinue the knee immobilizer once you are able to perform a straight leg raise while lying down. You may resume a sexual relationship in one   month or when given the OK by your doctor.  You may return to work once you are cleared by your doctor.  Do not drive a car for 6 weeks or until released by your surgeon.  Do not drive while taking narcotics.  TED HOSE STOCKINGS Wear the elastic stockings on both legs for three weeks following surgery during the day. You may remove them at night for sleeping.  WEIGHT  BEARING Weight bearing as tolerated with assist device (walker, cane, etc) as directed, use it as long as suggested by your surgeon or therapist, typically at least 4-6 weeks.  POSTOPERATIVE CONSTIPATION PROTOCOL Constipation - defined medically as fewer than three stools per week and severe constipation as less than one stool per week.  One of the most common issues patients have following surgery is constipation.  Even if you have a regular bowel pattern at home, your normal regimen is likely to be disrupted due to multiple reasons following surgery.  Combination of anesthesia, postoperative narcotics, change in appetite and fluid intake all can affect your bowels.  In order to avoid complications following surgery, here are some recommendations in order to help you during your recovery period.  Colace (docusate) - Pick up an over-the-counter form of Colace or another stool softener and take twice a day as long as you are requiring postoperative pain medications.  Take with a full glass of water daily.  If you experience loose stools or diarrhea, hold the colace until you stool forms back up. If your symptoms do not get better within 1 week or if they get worse, check with your doctor. Dulcolax (bisacodyl) - Pick up over-the-counter and take as directed by the product packaging as needed to assist with the movement of your bowels.  Take with a full glass of water.  Use this product as needed if not relieved by Colace only.  MiraLax (polyethylene glycol) - Pick up over-the-counter to have on hand. MiraLax is a solution that will increase the amount of water in your bowels to assist with bowel movements.  Take as directed and can mix with a glass of water, juice, soda, coffee, or tea. Take if you go more than two days without a movement. Do not use MiraLax more than once per day. Call your doctor if you are still constipated or irregular after using this medication for 7 days in a row.  If you continue  to have problems with postoperative constipation, please contact the office for further assistance and recommendations.  If you experience "the worst abdominal pain ever" or develop nausea or vomiting, please contact the office immediatly for further recommendations for treatment.  ITCHING If you experience itching with your medications, try taking only a single pain pill, or even half a pain pill at a time.  You can also use Benadryl over the counter for itching or also to help with sleep.   MEDICATIONS See your medication summary on the "After Visit Summary" that the nursing staff will review with you prior to discharge.  You may have some home medications which will be placed on hold until you complete the course of blood thinner medication.  It is important for you to complete the blood thinner medication as prescribed by your surgeon.  Continue your approved medications as instructed at time of discharge.  PRECAUTIONS If you experience chest pain or shortness of breath - call 911 immediately for transfer to the hospital emergency department.  If you develop a fever greater that 101   F, purulent drainage from wound, increased redness or drainage from wound, foul odor from the wound/dressing, or calf pain - CONTACT YOUR SURGEON.                                                   FOLLOW-UP APPOINTMENTS Make sure you keep all of your appointments after your operation with your surgeon and caregivers. You should call the office at the above phone number and make an appointment for approximately two weeks after the date of your surgery or on the date instructed by your surgeon outlined in the "After Visit Summary".  RANGE OF MOTION AND STRENGTHENING EXERCISES  Rehabilitation of the knee is important following a knee injury or an operation. After just a few days of immobilization, the muscles of the thigh which control the knee become weakened and shrink (atrophy). Knee exercises are designed to build up  the tone and strength of the thigh muscles and to improve knee motion. Often times heat used for twenty to thirty minutes before working out will loosen up your tissues and help with improving the range of motion but do not use heat for the first two weeks following surgery. These exercises can be done on a training (exercise) mat, on the floor, on a table or on a bed. Use what ever works the best and is most comfortable for you Knee exercises include:  Leg Lifts - While your knee is still immobilized in a splint or cast, you can do straight leg raises. Lift the leg to 60 degrees, hold for 3 sec, and slowly lower the leg. Repeat 10-20 times 2-3 times daily. Perform this exercise against resistance later as your knee gets better.  Quad and Hamstring Sets - Tighten up the muscle on the front of the thigh (Quad) and hold for 5-10 sec. Repeat this 10-20 times hourly. Hamstring sets are done by pushing the foot backward against an object and holding for 5-10 sec. Repeat as with quad sets.  Leg Slides: Lying on your back, slowly slide your foot toward your buttocks, bending your knee up off the floor (only go as far as is comfortable). Then slowly slide your foot back down until your leg is flat on the floor again. Angel Wings: Lying on your back spread your legs to the side as far apart as you can without causing discomfort.  A rehabilitation program following serious knee injuries can speed recovery and prevent re-injury in the future due to weakened muscles. Contact your doctor or a physical therapist for more information on knee rehabilitation.   POST-OPERATIVE OPIOID TAPER INSTRUCTIONS: It is important to wean off of your opioid medication as soon as possible. If you do not need pain medication after your surgery it is ok to stop day one. Opioids include: Codeine, Hydrocodone(Norco, Vicodin), Oxycodone(Percocet, oxycontin) and hydromorphone amongst others.  Long term and even short term use of opiods can  cause: Increased pain response Dependence Constipation Depression Respiratory depression And more.  Withdrawal symptoms can include Flu like symptoms Nausea, vomiting And more Techniques to manage these symptoms Hydrate well Eat regular healthy meals Stay active Use relaxation techniques(deep breathing, meditating, yoga) Do Not substitute Alcohol to help with tapering If you have been on opioids for less than two weeks and do not have pain than it is ok to stop all together.    Plan to wean off of opioids This plan should start within one week post op of your joint replacement. Maintain the same interval or time between taking each dose and first decrease the dose.  Cut the total daily intake of opioids by one tablet each day Next start to increase the time between doses. The last dose that should be eliminated is the evening dose.   IF YOU ARE TRANSFERRED TO A SKILLED REHAB FACILITY If the patient is transferred to a skilled rehab facility following release from the hospital, a list of the current medications will be sent to the facility for the patient to continue.  When discharged from the skilled rehab facility, please have the facility set up the patient's Home Health Physical Therapy prior to being released. Also, the skilled facility will be responsible for providing the patient with their medications at time of release from the facility to include their pain medication, the muscle relaxants, and their blood thinner medication. If the patient is still at the rehab facility at time of the two week follow up appointment, the skilled rehab facility will also need to assist the patient in arranging follow up appointment in our office and any transportation needs.  MAKE SURE YOU:  Understand these instructions.  Get help right away if you are not doing well or get worse.   DENTAL ANTIBIOTICS:  In most cases prophylactic antibiotics for Dental procdeures after total joint surgery are  not necessary.  Exceptions are as follows:  1. History of prior total joint infection  2. Severely immunocompromised (Organ Transplant, cancer chemotherapy, Rheumatoid biologic meds such as Humera)  3. Poorly controlled diabetes (A1C &gt; 8.0, blood glucose over 200)  If you have one of these conditions, contact your surgeon for an antibiotic prescription, prior to your dental procedure.    Pick up stool softner and laxative for home use following surgery while on pain medications. Do not submerge incision under water. Please use good hand washing techniques while changing dressing each day. May shower starting three days after surgery. Please use a clean towel to pat the incision dry following showers. Continue to use ice for pain and swelling after surgery. Do not use any lotions or creams on the incision until instructed by your surgeon.      Information on my medicine - XARELTO (Rivaroxaban)  This medication education was reviewed with me or my healthcare representative as part of my discharge preparation.    Why was Xarelto prescribed for you? Xarelto was prescribed for you to reduce the risk of blood clots forming after orthopedic surgery. The medical term for these abnormal blood clots is venous thromboembolism (VTE).  What do you need to know about xarelto ? Take your Xarelto ONCE DAILY at the same time every day. You may take it either with or without food.  If you have difficulty swallowing the tablet whole, you may crush it and mix in applesauce just prior to taking your dose.  Take Xarelto exactly as prescribed by your doctor and DO NOT stop taking Xarelto without talking to the doctor who prescribed the medication.  Stopping without other VTE prevention medication to take the place of Xarelto may increase your risk of developing a clot.  After discharge, you should have regular check-up appointments with your healthcare provider that is prescribing your  Xarelto.    What do you do if you miss a dose? If you miss a dose, take it as soon as you remember on the same   day then continue your regularly scheduled once daily regimen the next day. Do not take two doses of Xarelto on the same day.   Important Safety Information A possible side effect of Xarelto is bleeding. You should call your healthcare provider right away if you experience any of the following: Bleeding from an injury or your nose that does not stop. Unusual colored urine (red or dark brown) or unusual colored stools (red or black). Unusual bruising for unknown reasons. A serious fall or if you hit your head (even if there is no bleeding).  Some medicines may interact with Xarelto and might increase your risk of bleeding while on Xarelto. To help avoid this, consult your healthcare provider or pharmacist prior to using any new prescription or non-prescription medications, including herbals, vitamins, non-steroidal anti-inflammatory drugs (NSAIDs) and supplements.  This website has more information on Xarelto: www.xarelto.com.   

## 2020-08-11 NOTE — Anesthesia Procedure Notes (Signed)
Spinal  Patient location during procedure: OR Reason for block: surgical anesthesia Staffing Performed: resident/CRNA  Resident/CRNA: Duff Pozzi D, CRNA Preanesthetic Checklist Completed: patient identified, IV checked, site marked, risks and benefits discussed, surgical consent, monitors and equipment checked, pre-op evaluation and timeout performed Spinal Block Patient position: sitting Prep: DuraPrep Patient monitoring: heart rate, continuous pulse ox and blood pressure Approach: midline Location: L2-3 Injection technique: single-shot Needle Needle type: Sprotte  Needle gauge: 24 G Needle length: 9 cm Assessment Sensory level: T6 Events: CSF return Additional Notes Expiration date of kit checked and confirmed. Patient tolerated procedure well, without complications.

## 2020-08-11 NOTE — Evaluation (Signed)
Physical Therapy Evaluation Patient Details Name: Morgan Hall MRN: [id number] DOB: August 29, 1945 Today's Date: 08/11/2020   History of Present Illness  Pt is a 75 y.o. 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  Clinical Impression  Pt is greek speaking, was able to hold conversation in [place] with PT effectively and daughter, who reports that she interprets for pt when needed, was present throughout session. Pt is a 75 y.o. 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 Pt required MIN A for sit to stand transfer and ambulation for pt safety/stability with multiple cues for RW management, no LOB observed. Pt displayed shuffling gait pattern initially with improved foot clearance following cues from PT. Pt expressed concerns about fall prevention as well as car transfers upon d/c, PT provided education to pt and family. PT reviewed therapeutic interventions for promotion of DVT prevention, pt demonstrated understanding. Recommend home with family care and OPPT. Pt will benefit form continued skilled physical therapy in order to address listed impairments and maximize functional mobility.     Follow Up Recommendations Follow surgeon's recommendation for DC plan and follow-up therapies;Outpatient PT    Equipment Recommendations  None recommended by PT (pt has RW at home for use)    Recommendations for Other Services       Precautions / Restrictions Precautions Precautions: Fall Restrictions Weight Bearing Restrictions: No Other Position/Activity Restrictions: WBAT      Mobility  Bed Mobility Overal bed mobility: Needs Assistance Bed Mobility: Supine to Sit     Supine to sit: Supervision;HOB elevated     General bed mobility comments: PT reviewed WBAT status multiple times during mobility to reassure pt. Supervision for safety with extra time for supine to sit transfer. Pt was able to use B UEs to scoot to EOB.     Transfers Overall transfer level: Needs assistance Equipment used: Rolling walker (2 wheeled) Transfers: Sit to/from Stand Sit to Stand: Min assist         General transfer comment: MIN assist for safety/stability with sit to stand and cues for safe hand placement  Ambulation/Gait Ambulation/Gait assistance: Min assist;Min guard Gait Distance (Feet): 35 Feet Assistive device: Rolling walker (2 wheeled) Gait Pattern/deviations: Step-to pattern;Decreased stride length;Decreased weight shift to right;Shuffle Gait velocity: decr   General Gait Details: Pt performed pre gait marching with use of B UEs on RW with no knee buckling noted. PT provided cues for step to gait pattern, pt required multple verbal/tactile cues to maintain safe proximity to RW. Pt with shuffling steps initially with improved foot clearance following cues and education from PT for increased WB through RW for pain control, no observed LOB during gait with MIN A from PT for stability/safety.  Stairs            Wheelchair Mobility    Modified Rankin (Stroke Patients Only)       Balance Overall balance assessment: Needs assistance Sitting-balance support: Feet supported Sitting balance-Leahy Scale: Good     Standing balance support: Bilateral upper extremity supported Standing balance-Leahy Scale: Poor Standing balance comment: use of RW to maintain standing balance                             Pertinent Vitals/Pain Pain Assessment: 0-10 Pain Score: 5  Pain Location: Rt knee Pain Descriptors / Indicators: Discomfort;Sore Pain Intervention(s): Limited activity within patient's tolerance;Monitored during session;Repositioned;Ice applied;Patient requesting pain meds-RN notified  Home Living Family/patient expects to be discharged to:: Private residence Living Arrangements: Spouse/significant other Available Help at Discharge: Family Type of Home: House Home Access: Stairs to  enter Entrance Stairs-Rails: None Entrance Stairs-Number of Steps: 1 Home Layout: One level Home Equipment: Cane - single point;Walker - 2 wheels;Bedside commode;Shower seat;Grab bars - tub/shower Additional Comments: Pt has 1 step down to enter home layout. daughter will be staying 2-3 weeks to assist upon discharge and typically checks in on pt and her husband every weekend.    Prior Function Level of Independence: Independent with assistive device(s)         Comments: use of SPC for mobility. Pt denies h/o falls     Hand Dominance   Dominant Hand: Right    Extremity/Trunk Assessment   Upper Extremity Assessment Upper Extremity Assessment: Overall WFL for tasks assessed    Lower Extremity Assessment Lower Extremity Assessment: RLE deficits/detail RLE Deficits / Details: pt with 4/5 quad set and DF/PF strength. Pt was able to complete full SLR with no extensor lag noted. RLE Sensation: WNL RLE Coordination: WNL    Cervical / Trunk Assessment Cervical / Trunk Assessment: Normal  Communication   Communication: Prefers language other than English (pt is greek speaking, Per RN pt has been able to communicate in [place] well. Pt was able to communicate with PT effectively in English during session. Pt's daughter who reports that she translates for pt when needed was present throughout session)  Cognition Arousal/Alertness: Awake/alert Behavior During Therapy: WFL for tasks assessed/performed Overall Cognitive Status: Within Functional Limits for tasks assessed                                        General Comments General comments (skin integrity, edema, etc.): Pt and family with multiple questions regarding d/c plans and current mobility level. PT eduated pt on safe transfer techniques while at home and for getting in and out of the car, benefits and processes of OPPT, fall prevention and appropriate steps to take if any incident occured following discharge.  PT also educated pt on use of RW for stability/safety vs. SPC that she uses at baseline, pt and family verbalized understanding.    Exercises Total Joint Exercises Ankle Circles/Pumps: AROM;Both;20 reps;Seated   Assessment/Plan    PT Assessment Patient needs continued PT services  PT Problem List Decreased strength;Decreased range of motion;Decreased activity tolerance;Decreased balance;Decreased mobility;Decreased knowledge of use of DME       PT Treatment Interventions DME instruction;Gait training;Stair training;Functional mobility training;Therapeutic activities;Therapeutic exercise;Patient/family education    PT Goals (Current goals can be found in the Care Plan section)  Acute Rehab PT Goals Patient Stated Goal: get better and be able to walk for exercise again PT Goal Formulation: With patient/family Time For Goal Achievement: 08/25/20 Potential to Achieve Goals: Good    Frequency 7X/week   Barriers to discharge        Co-evaluation               AM-PAC PT "6 Clicks" Mobility  Outcome Measure Help needed turning from your back to your side while in a flat bed without using bedrails?: A Little Help needed moving from lying on your back to sitting on the side of a flat bed without using bedrails?: A Little Help needed moving to and from a bed to a chair (including a wheelchair)?: A Little Help needed standing up  from a chair using your arms (e.g., wheelchair or bedside chair)?: A Little Help needed to walk in hospital room?: A Little Help needed climbing 3-5 steps with a railing? : A Lot 6 Click Score: 17    End of Session Equipment Utilized During Treatment: Gait belt Activity Tolerance: Patient tolerated treatment well Patient left: in chair;with call bell/phone within reach;with family/visitor present Nurse Communication: Mobility status PT Visit Diagnosis: Unsteadiness on feet (R26.81);Muscle weakness (generalized) (M62.81);Pain Pain - Right/Left:  Right Pain - part of body: Knee    Time: 0217-0310 PT Time Calculation (min) (ACUTE ONLY): 53 min   Charges:   PT Evaluation $PT Eval Low Complexity: 1 Low PT Treatments $Therapeutic Activity: 38-52 mins        Lyman Speller PT, DPT  Acute Rehabilitation Services  Office [phone]

## 2020-08-11 NOTE — Care Plan (Signed)
Ortho Bundle Case Management Note  Patient Details  Name: Morgan Hall MRN: [id number] Date of Birth: 1945/03/08                  R TKA on 08/11/20. DCP: Home with spouse. 1 story home with 0 steps. DME: RW ordered through [employer]. Has 3in1. PT: [place] [employer] 6/16   DME Arranged:  Dan Humphreys rolling DME Agency:  [employer]  HH Arranged:    HH Agency:     Additional Comments: Please contact me with any questions of if this plan should need to change.  Ennis Forts, RN,CCM [facility]  [phone] 08/11/2020, 8:43 AM

## 2020-08-12 DIAGNOSIS — M1711 Unilateral primary osteoarthritis, right knee: Secondary | ICD-10-CM | POA: Diagnosis not present

## 2020-08-12 LAB — BASIC METABOLIC PANEL
Anion gap: 9 (ref 5–15)
BUN: 26 mg/dL — ABNORMAL HIGH (ref 8–23)
CO2: 22 mmol/L (ref 22–32)
Calcium: 8.6 mg/dL — ABNORMAL LOW (ref 8.9–10.3)
Chloride: 105 mmol/L (ref 98–111)
Creatinine, Ser: 0.78 mg/dL (ref 0.44–1.00)
GFR, Estimated: >60 mL/min (ref >60)
Glucose, Bld: 148 mg/dL — ABNORMAL HIGH (ref 70–99)
Potassium: 4 mmol/L (ref 3.5–5.1)
Sodium: 136 mmol/L (ref 135–145)

## 2020-08-12 LAB — CBC
HCT: 33.2 % — ABNORMAL LOW (ref 36.0–46.0)
Hemoglobin: 10.7 g/dL — ABNORMAL LOW (ref 12.0–15.0)
MCH: 30.5 pg (ref 26.0–34.0)
MCHC: 32.2 g/dL (ref 30.0–36.0)
MCV: 94.6 fL (ref 80.0–100.0)
Platelets: 248 10*3/uL (ref 150–400)
RBC: 3.51 MIL/uL — ABNORMAL LOW (ref 3.87–5.11)
RDW: 13.1 % (ref 11.5–15.5)
WBC: 16.8 10*3/uL — ABNORMAL HIGH (ref 4.0–10.5)
nRBC: 0 % (ref 0.0–0.2)

## 2020-08-12 LAB — GLUCOSE, CAPILLARY
Glucose-Capillary: 140 mg/dL — ABNORMAL HIGH (ref 70–99)
Glucose-Capillary: 105 mg/dL — ABNORMAL HIGH (ref 70–99)

## 2020-08-12 MED ORDER — OXYCODONE HCL 5 MG PO TABS: 5.0000 mg | ORAL_TABLET | Freq: Four times a day (QID) | ORAL | 0 refills | Status: AC | PRN

## 2020-08-12 MED ORDER — RIVAROXABAN 10 MG PO TABS: 10.0000 mg | ORAL_TABLET | Freq: Every day | ORAL | 0 refills | Status: AC

## 2020-08-12 MED ORDER — GABAPENTIN 100 MG PO CAPS: ORAL_CAPSULE | ORAL | 0 refills | Status: AC

## 2020-08-12 MED ORDER — METHOCARBAMOL 500 MG PO TABS: 500.0000 mg | ORAL_TABLET | Freq: Four times a day (QID) | ORAL | 0 refills | Status: AC | PRN

## 2020-08-12 MED ORDER — TRAMADOL HCL 50 MG PO TABS: 50.0000 mg | ORAL_TABLET | Freq: Four times a day (QID) | ORAL | 0 refills | Status: AC | PRN

## 2020-08-12 NOTE — TOC Transition Note (Signed)
Transition of Care [facility]) - CM/SW Discharge Note   Patient Details  Name: Morgan Hall MRN: [id number] Date of Birth: 11-25-1945  Transition of Care [facility]) CM/SW Contact:  Lennart Pall, LCSW Phone Number: 08/12/2020, 10:38 AM   Clinical Narrative:     Met with pt and family who report no need for rolling walker as she has borrowed from a family member.  Confirm plan for OPPT at [facility] in [facility].  No further TOC needs.  Final next level of care: OP Rehab Barriers to Discharge: No Barriers Identified   Patient Goals and CMS Choice Patient states their goals for this hospitalization and ongoing recovery are:: return home      Discharge Placement                       Discharge Plan and Services                DME Arranged: N/A DME Agency: NA                  Social Determinants of Health (SDOH) Interventions     Readmission Risk Interventions No flowsheet data found.

## 2020-08-12 NOTE — Progress Notes (Signed)
   Subjective: 1 Day Post-Op Procedure(s) (LRB): TOTAL KNEE ARTHROPLASTY (Right) Patient reports pain as mild.   Patient seen in rounds by Dr. Lequita Halt. Patient is well, and has had no acute complaints or problems. No issues overnight. Denies chest pain, SOB, or calf pain. Foley catheter to be removed this AM. We will continue therapy today, ambulated 35' yesterday.   Objective: Vital signs in last 24 hours: Temp:  [97.6 F (36.4 C)-98.2 F (36.8 C)] 98.2 F (36.8 C) (06/14 0530) Pulse Rate:  [69-89] 69 (06/14 0530) Resp:  [12-20] 18 (06/14 0530) BP: (120-147)/(56-77) 125/59 (06/14 0530) SpO2:  [93 %-100 %] 95 % (06/14 0530)  Intake/Output from previous day:  Intake/Output Summary (Last 24 hours) at 08/12/2020 0739 Last data filed at 08/12/2020 0600 Gross per 24 hour  Intake 3460 ml  Output 3050 ml  Net 410 ml     Intake/Output this shift: No intake/output data recorded.  Labs: Recent Labs    08/12/20 0304  HGB 10.7*   Recent Labs    08/12/20 0304  WBC 16.8*  RBC 3.51*  HCT 33.2*  PLT 248   Recent Labs    08/12/20 0304  NA 136  K 4.0  CL 105  CO2 22  BUN 26*  CREATININE 0.78  GLUCOSE 148*  CALCIUM 8.6*   No results for input(s): LABPT, INR in the last 72 hours.  Exam: General - Patient is Alert and Oriented Extremity - Neurologically intact Neurovascular intact Sensation intact distally Dorsiflexion/Plantar flexion intact Dressing - dressing C/D/I Motor Function - intact, moving foot and toes well on exam.   Past Medical History:  Diagnosis Date   Anxiety    Diabetes mellitus without complication (HCC)    Heart murmur    High cholesterol    Hypertension     Assessment/Plan: 1 Day Post-Op Procedure(s) (LRB): TOTAL KNEE ARTHROPLASTY (Right) Principal Problem:   OA (osteoarthritis) of knee  Estimated body mass index is 30.33 kg/m as calculated from the following:   Height as of this encounter: 5\' 2"  (1.575 m).   Weight as of this  encounter: 75.2 kg. Advance diet Up with therapy D/C IV fluids   Patient's anticipated LOS is less than 2 midnights, meeting these requirements: - Younger than 24 - Lives within 1 hour of care - Has a competent adult at home to recover with post-op recover - NO history of  - Chronic pain requiring opiods  - Diabetes  - Coronary Artery Disease  - Heart failure  - Heart attack  - Stroke  - DVT/VTE  - Cardiac arrhythmia  - Respiratory Failure/COPD  - Renal failure  - Anemia  - Advanced Liver disease  DVT Prophylaxis - Xarelto Weight bearing as tolerated. Continue therapy.  Plan is to go Home after hospital stay. Plan for discharge later today if cleared by PT. Scheduled for OPP[facility] at [facility]) Follow-up in the office in 2 weeks  The PDMP database was reviewed today prior to any opioid medications being prescribed to this patient.  [facility], PA-C Orthopedic Surgery [phone] 08/12/2020, 7:39 AM

## 2020-08-12 NOTE — Progress Notes (Signed)
Physical Therapy Treatment Patient Details Name: Morgan Hall MRN: [id number] DOB: Feb 22, 1946 Today's Date: 08/12/2020    History of Present Illness Pt is a 75 y.o. greek speaking female s/p Rt TKA on 08/11/20 withPMH significant for anxiety, DM, HTN    PT Comments    Progressing with mobility. Reviewed/practiced exercises, gait training, and stair training. All education completed. Pt is eager to d/c home. Okay to d/c from PT standpoint.    Follow Up Recommendations  Follow surgeon's recommendation for DC plan and follow-up therapies     Equipment Recommendations  None recommended by PT    Recommendations for Other Services       Precautions / Restrictions Precautions Precautions: Fall;Knee Restrictions Weight Bearing Restrictions: No Other Position/Activity Restrictions: WBAT    Mobility  Bed Mobility Overal bed mobility: Needs Assistance Bed Mobility: Supine to Sit     Supine to sit: Min assist;HOB elevated     General bed mobility comments: Assist for trunk. Increased time. Cues for safety, technique.    Transfers Overall transfer level: Needs assistance Equipment used: Rolling walker (2 wheeled) Transfers: Sit to/from Stand Sit to Stand: Min assist         General transfer comment: Assist to steady. Increased time. Cues for safety, technique, hand placement.  Ambulation/Gait Ambulation/Gait assistance: Min assist Gait Distance (Feet): 75 Feet Assistive device: Rolling walker (2 wheeled) Gait Pattern/deviations: Step-to pattern     General Gait Details: Cues for safety, technique, sequence, RW proximity, step lengths. Min guard for safety. Pt denied dizziness.   Stairs             Wheelchair Mobility    Modified Rankin (Stroke Patients Only)       Balance Overall balance assessment: Needs assistance         Standing balance support: Bilateral upper extremity supported Standing balance-Leahy Scale: Poor                               Cognition Arousal/Alertness: Awake/alert Behavior During Therapy: WFL for tasks assessed/performed Overall Cognitive Status: Within Functional Limits for tasks assessed                                        Exercises Total Joint Exercises Ankle Circles/Pumps: AROM;Both;10 reps Quad Sets: AROM;Both;10 reps Hip ABduction/ADduction: AAROM;Right;10 reps Straight Leg Raises: AAROM;Right;10 reps Knee Flexion: AROM;Right;10 reps;Seated Goniometric ROM: ~10-65 degrees    General Comments        Pertinent Vitals/Pain Pain Assessment: 0-10 Pain Score: 7  Pain Location: Rt knee Pain Descriptors / Indicators: Discomfort;Sore Pain Intervention(s): Premedicated before session    Home Living                      Prior Function            PT Goals (current goals can now be found in the care plan section) Progress towards PT goals: Progressing toward goals    Frequency    7X/week      PT Plan Current plan remains appropriate    Co-evaluation              AM-PAC PT "6 Clicks" Mobility   Outcome Measure  Help needed turning from your back to your side while in a flat bed without using bedrails?: A Little Help needed moving from lying  on your back to sitting on the side of a flat bed without using bedrails?: A Little Help needed moving to and from a bed to a chair (including a wheelchair)?: A Little Help needed standing up from a chair using your arms (e.g., wheelchair or bedside chair)?: A Little Help needed to walk in hospital room?: A Little Help needed climbing 3-5 steps with a railing? : A Little 6 Click Score: 18    End of Session Equipment Utilized During Treatment: Gait belt Activity Tolerance: Patient tolerated treatment well Patient left: with call bell/phone within reach;with family/visitor present;with nursing/sitter in room (in bathroom with daughter and NT in room)   PT Visit Diagnosis: Other  abnormalities of gait and mobility (R26.89);Pain Pain - Right/Left: Right Pain - part of body: Knee     Time: 1043-1110 PT Time Calculation (min) (ACUTE ONLY): 27 min  Charges:  $Gait Training: 8-22 mins $Therapeutic Exercise: 8-22 mins                        Faye Ramsay, PT Acute Rehabilitation  Office: [phone] Pager: [phone]

## 2020-08-12 NOTE — Plan of Care (Signed)
Patient discharged home in stable condition 

## 2020-08-13 ENCOUNTER — Encounter (HOSPITAL_COMMUNITY): Payer: Self-pay | Admitting: Orthopedic Surgery

## 2020-08-13 LAB — HEMOGLOBIN A1C
Hgb A1c MFr Bld: 6.8 % — ABNORMAL HIGH (ref 4.8–5.6)
Mean Plasma Glucose: 148 mg/dL

## 2020-08-14 ENCOUNTER — Encounter: Payer: Self-pay | Admitting: Physical Therapy

## 2020-08-14 ENCOUNTER — Ambulatory Visit: Payer: PPO | Attending: Orthopedic Surgery | Admitting: Physical Therapy

## 2020-08-14 ENCOUNTER — Other Ambulatory Visit: Payer: Self-pay

## 2020-08-14 DIAGNOSIS — M25661 Stiffness of right knee, not elsewhere classified: Secondary | ICD-10-CM | POA: Insufficient documentation

## 2020-08-14 DIAGNOSIS — R2689 Other abnormalities of gait and mobility: Secondary | ICD-10-CM | POA: Diagnosis not present

## 2020-08-14 DIAGNOSIS — R262 Difficulty in walking, not elsewhere classified: Secondary | ICD-10-CM | POA: Diagnosis not present

## 2020-08-14 DIAGNOSIS — M6281 Muscle weakness (generalized): Secondary | ICD-10-CM | POA: Diagnosis not present

## 2020-08-14 DIAGNOSIS — M25561 Pain in right knee: Secondary | ICD-10-CM | POA: Insufficient documentation

## 2020-08-14 DIAGNOSIS — R600 Localized edema: Secondary | ICD-10-CM | POA: Diagnosis not present

## 2020-08-14 NOTE — Therapy (Signed)
[facility] [address]  [address] [place], [place], [address] Phone: [phone]   Fax:  [phone]  Physical Therapy Evaluation  Patient Details  Name: Morgan Hall MRN: [id number] Date of Birth: 1945/09/07 Referring Provider (PT): Ollen Gross, MD   Encounter Date: 08/14/2020   PT End of Session - 08/14/20 1058     Visit Number 1    Number of Visits 13    Date for PT Re-Evaluation 09/11/20    PT Start Time 1058    PT Stop Time 1217    PT Time Calculation (min) 79 min    Activity Tolerance Patient tolerated treatment well    Behavior During Therapy Anxious             Past Medical History:  Diagnosis Date   Anxiety    Diabetes mellitus without complication (HCC)    Heart murmur    High cholesterol    Hypertension     Past Surgical History:  Procedure Laterality Date   colonscopy     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    Subjective Assessment - 08/14/20 1102     Subjective Pt s/p R TKA on Monday with next day discharge home. Mobility at home has been slow and painful. Has a hard time moving her R leg. Mornings are the worst - very stiff.    Patient is accompained by: Family member   [name] [facility]) - also assists with interpretation   Pertinent History R TKA 08/11/20    Limitations Sitting;Standing;Walking;House hold activities    How long can you walk comfortably? room to room    Patient Stated Goals "moving around like I used to before w/o a cane"    Currently in Pain? Yes    Pain Score 5    4-5/10   Pain Location Knee    Pain Orientation Right    Pain Descriptors / Indicators Dull;Aching    Pain Type Surgical pain;Acute pain    Pain Radiating Towards into R thigh and occasionally up to R buttock     Pain Onset In the past 7 days    Pain Frequency Intermittent    Aggravating Factors  bending her knee, standing or walking    Pain Relieving Factors pain meds & ice                [facility] PT Assessment - 08/14/20 1058       Assessment   Medical Diagnosis R TKA    Referring Provider (PT) Ollen Gross, MD    Onset Date/Surgical Date 08/11/20    Hand Dominance Right    Next MD Visit 08/26/20    Prior Therapy PT for R knee in May & June 2021      Restrictions   Weight Bearing Restrictions No      Balance Screen   Has the patient fallen in the past 6 months No    Has the patient had a decrease in activity level because of a fear of falling?  Yes    Is the patient reluctant to leave their home because of a fear of falling?  Yes      Home Environment   Living Environment Private residence    Living Arrangements Spouse/significant other;Children    Available  Help at Discharge Family    Type of Home House    Home Access Stairs to enter    Entrance Stairs-Number of Steps 1    Home Layout One level    Home Equipment Walker - 2 wheels;Cane - single point;Bedside commode;Shower seat;Grab bars - tub/shower      Prior Function   Level of Independence Independent    Vocation Retired    Leisure outdoor activity - walking before her knee got bad, cooking      Cognition   Overall Cognitive Status Within Functional Limits for tasks assessed      Observation/Other Assessments   Focus on Therapeutic Outcomes (FOTO)  Knee = 31; predicted D/C FS = 68      ROM / Strength   AROM / PROM / Strength AROM;PROM;Strength      AROM   AROM Assessment Site Knee    Right/Left Knee Right;Left    Right Knee Extension 28   in LAQ; 14 when supported   Right Knee Flexion 84      Strength   Overall Strength Comments tested in sitting    Strength Assessment Site Hip;Knee;Ankle    Right/Left Hip Right;Left    Right Hip Flexion 2+/5    Right Hip Extension 3+/5    Right Hip ABduction 3+/5     Right Hip ADduction 3/5    Left Hip Flexion 4/5    Left Hip Extension 4/5    Left Hip ABduction 4+/5    Left Hip ADduction 4+/5    Right/Left Knee Right;Left    Right Knee Flexion 4/5    Right Knee Extension 3-/5    Left Knee Flexion 5/5    Right/Left Ankle Right;Left    Right Ankle Dorsiflexion 4/5    Left Ankle Dorsiflexion 5/5      Ambulation/Gait   Ambulation/Gait Yes    Ambulation/Gait Assistance 4: Min guard;5: Supervision    Ambulation/Gait Assistance Details cues for increased hip and knee flexion with increased stride length to promote improved foot clearance and normal step pattern    Ambulation Distance (Feet) 80 Feet    Assistive device Rolling walker    Gait Pattern Step-to pattern;Decreased step length - right;Decreased step length - left;Decreased stride length;Decreased weight shift to right;Decreased stance time - right;Right flexed knee in stance;Decreased hip/knee flexion - right;Decreased hip/knee flexion - left;Poor foot clearance - left;Poor foot clearance - right;Antalgic    Ambulation Surface Level;Indoor    Gait velocity decreased                        Objective measurements completed on examination: See above findings.       [facility] Adult PT Treatment/Exercise - 08/14/20 1058       Exercises   Exercises Knee/Hip      Knee/Hip Exercises: Seated   Long Arc Quad Right;10 reps;AROM    Heel Slides Right;10 reps;AROM    Heel Slides Limitations washcloth on floor to reduce friction      Knee/Hip Exercises: Supine   Quad Sets Right;10 reps;AROM;Strengthening    Heel Prop for Knee Extension 1 minute   seated with foot resting on trashcan   Straight Leg Raises Right;5 reps;AAROM    Straight Leg Raises Limitations pt unable to initiate or maintain SLR w/o assist      Modalities   Modalities Vasopneumatic      Vasopneumatic   Number Minutes Vasopneumatic  10 minutes    Vasopnuematic Location  Knee  Rt   Vasopneumatic Pressure Low     Vasopneumatic Temperature  34                    PT Education - 08/14/20 1210     Education Details PT eval findings, anticipated POC & initial HEP - Access Code: [email or web address]    Person(s) Educated Patient;Child(ren)    Methods Explanation;Demonstration;Verbal cues;Tactile cues;Handout    Comprehension Verbalized understanding;Verbal cues required;Tactile cues required;Returned demonstration;Need further instruction                 PT Long Term Goals - 08/14/20 1217       PT LONG TERM GOAL #1   Title Patient will be independent with ongoing/advanced HEP for self-management at home    Status New    Target Date 09/11/20      PT LONG TERM GOAL #2   Title Decrease R knee pain by >/= 50-75% allowing patient increased ease of exercise and mobility    Status New    Target Date 09/11/20      PT LONG TERM GOAL #3   Title Patient will demonstrate R knee AROM >/= 2-120 dg to allow for normal gait and stair mechanics    Status New    Target Date 09/11/20      PT LONG TERM GOAL #4   Title Patient will demonstrate improved R LE strength to >/= 4 to 4+/5 for improved stability and ease of mobility    Status New    Target Date 09/11/20      PT LONG TERM GOAL #5   Title Patient to demonstrate symmetrical step length, knee flexion, and good heel-toe pattern with upright posture when ambulating with or w/o SPC or LRAD    Status New    Target Date 09/11/20                    Plan - 08/14/20 1217     Clinical Impression Statement Morgan Hall is a 75 y/o female who presents to OP PT 3 days s/p R TKA on 08/11/20 with D/C home the next day. She arrived to via [employer] transport chair but was able to ambulate through the PT clinic with a step-to antalgic gait pattern with decreased stride length and decreased foot clearance but no evidence of knee instability. Deficits include R knee pain, diffuse moderate to significant edema in R knee and distal LE, limited R knee AROM (14-84  with 28 lacking in LAQ), R LE weakness with inability to initiate SLR, and limited gait tolerance with antalgic gait pattern and dependence on AD. Morgan Hall will benefit from skilled PT intervention to address the above listed deficits, reduce pain, and restore functional R knee ROM and strength to allow for improved knee stability for improved balance and gait tolerance to maximize function and safety with mobility in home and community. Reviewed recommendations for positioning of knee in recliner or on sofa to reduce risk of knee flexion contracture as well as ice, elevation and exercises to promote knee ROM and reduce post-op edema as well as risk for DVT.    Personal Factors and Comorbidities Age;Comorbidity 3+;Fitness;Past/Current Experience;Time since onset of injury/illness/exacerbation    Comorbidities OA, anxiety, DM, HTN, HLD, heart murmur    Examination-Activity Limitations Bathing;Bed Mobility;Bend;Caring for Others;Carry;Dressing;Hygiene/Grooming;Lift;Locomotion Level;Sit;Squat;Stairs;Stand;Toileting;Transfers    Examination-Participation Restrictions Cleaning;Community Activity;Driving;Laundry;Medication Management;Meal Prep;Shop    Stability/Clinical Decision Making Stable/Uncomplicated    Clinical Decision Making Low    Rehab Potential Good  PT Frequency 3x / week    PT Duration 4 weeks    PT Treatment/Interventions ADLs/Self Care Home Management;Cryotherapy;Electrical Stimulation;Iontophoresis 4mg /ml Dexamethasone;Moist Heat;DME Instruction;Gait training;Stair training;Functional mobility training;Therapeutic activities;Therapeutic exercise;Balance training;Neuromuscular re-education;Patient/family education;Manual techniques;Scar mobilization;Passive range of motion;Dry needling;Taping;Vasopneumatic Device;Joint Manipulations    PT Next Visit Plan Review initial HEP; progress R knee ROM and LE strengthening exercises; manual therapy for ROM and edema management; GameReady for  pain/edema management    PT Home Exercise Plan Access Code: [email or web address]    Consulted and Agree with Plan of Care Patient;Family member/caregiver    Family Member Consulted Daughter/POA - Morgan Hall             Patient will benefit from skilled therapeutic intervention in order to improve the following deficits and impairments:  Abnormal gait, Decreased activity tolerance, Decreased balance, Decreased endurance, Decreased knowledge of precautions, Decreased knowledge of use of DME, Decreased mobility, Decreased range of motion, Decreased safety awareness, Decreased scar mobility, Decreased strength, Difficulty walking, Increased edema, Increased fascial restricitons, Increased muscle spasms, Impaired perceived functional ability, Impaired flexibility, Impaired sensation, Improper body mechanics, Postural dysfunction, Pain  Visit Diagnosis: Stiffness of right knee, not elsewhere classified  Acute pain of right knee  Other abnormalities of gait and mobility  Difficulty in walking, not elsewhere classified  Muscle weakness (generalized)  Localized edema     Problem List Patient Active Problem List   Diagnosis Date Noted   OA (osteoarthritis) of knee 08/11/2020    Marry GuanJoAnne M Jassmin Kemmerer, PT, MPT 08/14/2020, 1:51 PM  [facility] [place] [address]  [address] [place], [place], [address] Phone: [phone]   Fax:  [phone]  Name: Morgan Hall MRN: [id number] Date of Birth: 07/22/1945

## 2020-08-14 NOTE — Patient Instructions (Addendum)
     Access Code: [email or web address] URL: [email or web address] Date: 08/14/2020 Prepared by: Glenetta Hew  Exercises Seated Heel Slide - 3 x daily - 7 x weekly - 2 sets - 10 reps - 3 sec hold Seated Long Arc Quad - 3 x daily - 7 x weekly - 2 sets - 10 reps - 3 sec hold Seated Passive Knee Extension - 3 x daily - 7 x weekly - 3 reps - 30-60 sec hold Quad Setting and Stretching - 3 x daily - 7 x weekly - 2 sets - 10 reps - 3-5 sec hold

## 2020-08-18 ENCOUNTER — Other Ambulatory Visit: Payer: Self-pay

## 2020-08-18 ENCOUNTER — Ambulatory Visit: Payer: PPO | Admitting: Physical Therapy

## 2020-08-18 ENCOUNTER — Encounter: Payer: Self-pay | Admitting: Physical Therapy

## 2020-08-18 DIAGNOSIS — M25661 Stiffness of right knee, not elsewhere classified: Secondary | ICD-10-CM

## 2020-08-18 DIAGNOSIS — M25561 Pain in right knee: Secondary | ICD-10-CM

## 2020-08-18 DIAGNOSIS — R262 Difficulty in walking, not elsewhere classified: Secondary | ICD-10-CM

## 2020-08-18 DIAGNOSIS — M6281 Muscle weakness (generalized): Secondary | ICD-10-CM

## 2020-08-18 DIAGNOSIS — R600 Localized edema: Secondary | ICD-10-CM

## 2020-08-18 DIAGNOSIS — R2689 Other abnormalities of gait and mobility: Secondary | ICD-10-CM

## 2020-08-18 NOTE — Therapy (Signed)
[facility] Outpatient Rehabilitation [facility] [address]  [address] [place], [place], [address] Phone: [phone]   Fax:  [phone]  Physical Therapy Treatment  Patient Details  Name: Morgan Hall MRN: [id number] Date of Birth: [birth year] Referring Provider (PT): Ollen Gross, MD   Encounter Date: 08/18/2020   PT End of Session - 08/18/20 1518     Visit Number 2    Number of Visits 13    Date for PT Re-Evaluation 09/11/20    Authorization Type [employer]    PT Start Time 1518    PT Stop Time 1613    PT Time Calculation (min) 55 min    Activity Tolerance Patient tolerated treatment well;Patient limited by pain    Behavior During Therapy [facility] for tasks assessed/performed;Anxious             Past Medical History:  Diagnosis Date   Anxiety    Diabetes mellitus without complication (HCC)    Heart murmur    High cholesterol    Hypertension     Past Surgical History:  Procedure Laterality Date   colonscopy     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   Subjective Assessment - 08/18/20 1524     Subjective Pt reports she is having a bad day - she is tired, hurting more today and feels that her knee is burning up.    Patient is accompained by: Family member   [name] [facility]) - also assists with interpretation   Pertinent History R TKA 08/11/20    Patient Stated Goals "moving around like I used to before w/o a cane"    Currently in Pain? Yes    Pain Score 5    4-5/10   Pain Location Knee    Pain Orientation Right    Pain Descriptors / Indicators Dull;Aching    Pain Type Surgical pain;Acute pain    Pain Onset In the past 7 days                               [facility] Adult PT Treatment/Exercise - 08/18/20 1518       Exercises    Exercises Knee/Hip      Knee/Hip Exercises: Stretches   Passive Hamstring Stretch Right;2 reps;30 seconds   2 sets   Passive Hamstring Stretch Limitations seated hip hinge & supine with strap    ITB Stretch Right;2 reps;30 seconds    ITB Stretch Limitations supine cross-body with strap      Knee/Hip Exercises: Seated   Long Arc Quad Right;10 reps;AROM    Heel Slides Right;10 reps;AROM    Heel Slides Limitations plastic bag under foot to reduce friction on carpet      Knee/Hip Exercises: Supine   Quad Sets Right;10 reps;AROM;Strengthening    Short Arc Quad Sets Right;10 reps;AROM    Heel Slides Right;10 reps;AAROM    Heel Slides Limitations strap assist    Straight Leg Raises Right;10 reps;AAROM;Strengthening    Knee Flexion Both;2 sets;10 reps;AROM;AAROM    Knee Flexion Limitations HS curls with heels on peanut ball - 2nd set with strap assist for flexion stretch    Other Supine Knee/Hip Exercises R hip ABD/ADD      Modalities   Modalities Vasopneumatic  Vasopneumatic   Number Minutes Vasopneumatic  10 minutes    Vasopnuematic Location  Knee   Rt   Vasopneumatic Pressure Low    Vasopneumatic Temperature  34      Manual Therapy   Manual Therapy Passive ROM    Passive ROM manual R HS & ITB stretches                         PT Long Term Goals - 08/18/20 1526       PT LONG TERM GOAL #1   Title Patient will be independent with ongoing/advanced HEP for self-management at home    Status On-going    Target Date 09/11/20      PT LONG TERM GOAL #2   Title Decrease R knee pain by >/= 50-75% allowing patient increased ease of exercise and mobility    Status On-going    Target Date 09/11/20      PT LONG TERM GOAL #3   Title Patient will demonstrate R knee AROM >/= 2-120 dg to allow for normal gait and stair mechanics    Status On-going    Target Date 09/11/20      PT LONG TERM GOAL #4   Title Patient will demonstrate improved R LE strength to >/= 4 to  4+/5 for improved stability and ease of mobility    Status On-going    Target Date 09/11/20      PT LONG TERM GOAL #5   Title Patient to demonstrate symmetrical step length, knee flexion, and good heel-toe pattern with upright posture when ambulating with or w/o SPC or LRAD    Status On-going    Target Date 09/11/20                   Plan - 08/18/20 1527     Clinical Impression Statement Morgan Hall remains very anxious to move her knee today - she c/o increased pain and warmth in knee as well as feeling tired. Some mild erythema and warmth noted along R edge of post-op bandage but no obvious signs of infection present. Exercises focusing on gentle ROM, stretching and muscle setting/strengthening to reduce pain and edema while promoting increased ROM and muscle control. Pt requiring a lot of positive reinforcement but able to complete all exercises/activities attempted and able to independently lift her R leg in SLR by end of session. Session concluded with GameReady vasopnuematic compression to minimize pain and edema. Reinforced increased R hip and knee flexion with increased step length and heel strike as pt ambulating out of clinic.    Personal Factors and Comorbidities Age;Comorbidity 3+;Fitness;Past/Current Experience;Time since onset of injury/illness/exacerbation    Comorbidities OA, anxiety, DM, HTN, HLD, heart murmur    Examination-Activity Limitations Bathing;Bed Mobility;Bend;Caring for Others;Carry;Dressing;Hygiene/Grooming;Lift;Locomotion Level;Sit;Squat;Stairs;Stand;Toileting;Transfers    Examination-Participation Restrictions Cleaning;Community Activity;Driving;Laundry;Medication Management;Meal Prep;Shop    Rehab Potential Good    PT Frequency 3x / week    PT Duration 4 weeks    PT Treatment/Interventions ADLs/Self Care Home Management;Cryotherapy;Electrical Stimulation;Iontophoresis 4mg /ml Dexamethasone;Moist Heat;DME Instruction;Gait training;Stair training;Functional  mobility training;Therapeutic activities;Therapeutic exercise;Balance training;Neuromuscular re-education;Patient/family education;Manual techniques;Scar mobilization;Passive range of motion;Dry needling;Taping;Vasopneumatic Device;Joint Manipulations    PT Next Visit Plan progress R knee ROM and LE strengthening exercises - attempt standing exercises?; gait training for normal gait pattern; manual therapy for ROM and edema management; GameReady for pain/edema management    PT Home Exercise Plan Access Code: [email or web address])    Consulted and Agree with Plan of Care Patient;Family member/caregiver  Family Member Consulted Daughter/POA - Morgan Hall & husband - Morgan Hall             Patient will benefit from skilled therapeutic intervention in order to improve the following deficits and impairments:  Abnormal gait, Decreased activity tolerance, Decreased balance, Decreased endurance, Decreased knowledge of precautions, Decreased knowledge of use of DME, Decreased mobility, Decreased range of motion, Decreased safety awareness, Decreased scar mobility, Decreased strength, Difficulty walking, Increased edema, Increased fascial restricitons, Increased muscle spasms, Impaired perceived functional ability, Impaired flexibility, Impaired sensation, Improper body mechanics, Postural dysfunction, Pain  Visit Diagnosis: Stiffness of right knee, not elsewhere classified  Acute pain of right knee  Other abnormalities of gait and mobility  Difficulty in walking, not elsewhere classified  Muscle weakness (generalized)  Localized edema     Problem List Patient Active Problem List   Diagnosis Date Noted   OA (osteoarthritis) of knee 08/11/2020    Marry Guan, PT, MPT 08/18/2020, 4:20 PM  [facility] [address]  [address] [place], [place], [address] Phone: [phone]   Fax:  [phone]  Name: Morgan Hall MRN: [id number] Date of  Birth: [birth year]

## 2020-08-19 ENCOUNTER — Ambulatory Visit: Payer: PPO | Admitting: Physical Therapy

## 2020-08-19 DIAGNOSIS — M25661 Stiffness of right knee, not elsewhere classified: Secondary | ICD-10-CM

## 2020-08-19 DIAGNOSIS — R262 Difficulty in walking, not elsewhere classified: Secondary | ICD-10-CM

## 2020-08-19 DIAGNOSIS — M25561 Pain in right knee: Secondary | ICD-10-CM

## 2020-08-19 DIAGNOSIS — R600 Localized edema: Secondary | ICD-10-CM

## 2020-08-19 DIAGNOSIS — M6281 Muscle weakness (generalized): Secondary | ICD-10-CM

## 2020-08-19 DIAGNOSIS — R2689 Other abnormalities of gait and mobility: Secondary | ICD-10-CM

## 2020-08-19 NOTE — Therapy (Signed)
[facility] Outpatient Rehabilitation [facility] [address]  [address] [place], [place], [address] Phone: [phone]   Fax:  [phone]  Physical Therapy Treatment  Patient Details  Name: Morgan Hall MRN: [id number] Date of Birth: [birth year] Referring Provider (PT): Ollen Gross, MD   Encounter Date: 08/19/2020   PT End of Session - 08/19/20 1615     Visit Number 3    Number of Visits 13    Date for PT Re-Evaluation 09/11/20    Authorization Type [employer]    PT Start Time 1615    PT Stop Time 1712    PT Time Calculation (min) 57 min    Activity Tolerance Patient tolerated treatment well;Patient limited by pain    Behavior During Therapy [facility] for tasks assessed/performed;Anxious             Past Medical History:  Diagnosis Date   Anxiety    Diabetes mellitus without complication (HCC)    Heart murmur    High cholesterol    Hypertension     Past Surgical History:  Procedure Laterality Date   colonscopy     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   Subjective Assessment - 08/19/20 1619     Subjective Pt reports she felt much better and looser after PT yesterday. She states her pain is not as bad today.    Patient is accompained by: Family member   [name]) - also assists with interpretation   Pertinent History R TKA 08/11/20    Patient Stated Goals "moving around like I used to before w/o a cane"    Currently in Pain? Yes    Pain Score 5     Pain Location Knee    Pain Orientation Right    Pain Descriptors / Indicators Dull;Aching    Pain Type Surgical pain;Acute pain    Pain Onset --    Pain Frequency Constant                [facility] PT Assessment - 08/19/20 1615       AROM   Right Knee Extension 22   in LAQ; 9 when  supported   Right Knee Flexion 82                           [facility] Adult PT Treatment/Exercise - 08/19/20 1615       Exercises   Exercises Knee/Hip      Knee/Hip Exercises: Stretches   Passive Hamstring Stretch Right;2 reps;30 seconds    Passive Hamstring Stretch Limitations seated hip hinge with foot resting on 6" step    Hip Flexor Stretch Right;2 reps;30 seconds    Hip Flexor Stretch Limitations mod thomas with PT assist    Gastroc Stretch Right;2 reps;30 seconds    Gastroc Stretch Limitations UE support on back of chair      Knee/Hip Exercises: Aerobic   Nustep L2 x 6 min (UE/LE)      Knee/Hip Exercises: Standing   Hip Flexion Right;10 reps;AROM;Stengthening;Knee bent    Hip Flexion Limitations RW for support/balance    Hip Abduction Right;10 reps;AROM;Stengthening;Knee straight    Abduction Limitations RW for support/balance    Hip Extension Right;10 reps;AROM;Stengthening;Knee straight    Extension Limitations RW for  support/balance      Knee/Hip Exercises: Seated   Heel Slides Right;10 reps;AROM;Strengthening    Heel Slides Limitations ball under foot    Ball Squeeze 10 x 3-5"      Knee/Hip Exercises: Supine   Quad Sets Right;10 reps;AROM;Strengthening    Quad Sets Limitations 5" hold with PT providing gentle longitudinal distraction    Short Arc Quad Sets Right;10 reps;AROM;Strengthening    Short Arc Quad Sets Limitations over 8" FR    Heel Prop for Knee Extension 1 minute    Straight Leg Raises Right;2 sets;5 reps;AROM;Strengthening    Straight Leg Raises Limitations pt able to complete SLR w/o PT assist today but mild quad lag evident despite cues to intiate motion with QS      Modalities   Modalities Vasopneumatic      Vasopneumatic   Number Minutes Vasopneumatic  10 minutes    Vasopnuematic Location  Knee   Rt   Vasopneumatic Pressure Low    Vasopneumatic Temperature  34      Manual Therapy   Manual Therapy Soft tissue mobilization     Soft tissue mobilization gentle retrograde massage for edema management; roller stick to reduce HS and ITB tightness                         PT Long Term Goals - 08/18/20 1526       PT LONG TERM GOAL #1   Title Patient will be independent with ongoing/advanced HEP for self-management at home    Status On-going    Target Date 09/11/20      PT LONG TERM GOAL #2   Title Decrease R knee pain by >/= 50-75% allowing patient increased ease of exercise and mobility    Status On-going    Target Date 09/11/20      PT LONG TERM GOAL #3   Title Patient will demonstrate R knee AROM >/= 2-120 dg to allow for normal gait and stair mechanics    Status On-going    Target Date 09/11/20      PT LONG TERM GOAL #4   Title Patient will demonstrate improved R LE strength to >/= 4 to 4+/5 for improved stability and ease of mobility    Status On-going    Target Date 09/11/20      PT LONG TERM GOAL #5   Title Patient to demonstrate symmetrical step length, knee flexion, and good heel-toe pattern with upright posture when ambulating with or w/o SPC or LRAD    Status On-going    Target Date 09/11/20                   Plan - 08/19/20 1622     Clinical Impression Statement Morgan Hall reports her knee felt better following PT session yesterday and states pain is less today although pain rating unchanged. R knee extension ROM improving however flexion mildly more limited today due to ongoing edema - reassured pt that edema is normal following TKA and will gradually subside over time. Introduced standing exercises but only able to perform motions with R LE as she was not comfortable shifting full weight onto R LE to move L LE through ROM at this time. Pt remains very anxious about pain and swelling as well as normal expectations following surgery and requires frequent reassurance throughout session. GameReady vasopnuematic compression used at end of session to minimize post-exercise pain and  edema.    Personal Factors and Comorbidities Age;Comorbidity  3+;Fitness;Past/Current Experience;Time since onset of injury/illness/exacerbation    Comorbidities OA, anxiety, DM, HTN, HLD, heart murmur    Examination-Activity Limitations Bathing;Bed Mobility;Bend;Caring for Others;Carry;Dressing;Hygiene/Grooming;Lift;Locomotion Level;Sit;Squat;Stairs;Stand;Toileting;Transfers    Examination-Participation Restrictions Cleaning;Community Activity;Driving;Laundry;Medication Management;Meal Prep;Shop    Rehab Potential Good    PT Frequency 3x / week    PT Duration 4 weeks    PT Treatment/Interventions ADLs/Self Care Home Management;Cryotherapy;Electrical Stimulation;Iontophoresis 4mg /ml Dexamethasone;Moist Heat;DME Instruction;Gait training;Stair training;Functional mobility training;Therapeutic activities;Therapeutic exercise;Balance training;Neuromuscular re-education;Patient/family education;Manual techniques;Scar mobilization;Passive range of motion;Dry needling;Taping;Vasopneumatic Device;Joint Manipulations    PT Next Visit Plan progress R knee ROM and LE strengthening exercises - progress standing exercises as tolerated; gait training for normal gait pattern; manual therapy for ROM and edema management; GameReady for pain/edema management    PT Home Exercise Plan Access Code: [email or web address])    Consulted and Agree with Plan of Care Patient;Family member/caregiver    Family Member Consulted Daughter/POA - Betsy & husband - Alex             Patient will benefit from skilled therapeutic intervention in order to improve the following deficits and impairments:  Abnormal gait, Decreased activity tolerance, Decreased balance, Decreased endurance, Decreased knowledge of precautions, Decreased knowledge of use of DME, Decreased mobility, Decreased range of motion, Decreased safety awareness, Decreased scar mobility, Decreased strength, Difficulty walking, Increased edema, Increased fascial restricitons,  Increased muscle spasms, Impaired perceived functional ability, Impaired flexibility, Impaired sensation, Improper body mechanics, Postural dysfunction, Pain  Visit Diagnosis: Stiffness of right knee, not elsewhere classified  Acute pain of right knee  Other abnormalities of gait and mobility  Difficulty in walking, not elsewhere classified  Muscle weakness (generalized)  Localized edema     Problem List Patient Active Problem List   Diagnosis Date Noted   OA (osteoarthritis) of knee 08/11/2020    08/13/2020, PT, MPT 08/19/2020, 5:45 PM  [facility] Outpatient Rehabilitation [facility] [address]  [address] [place], [place], [place] Phone: [phone]   Fax:  [phone]  Name: Morgan Hall MRN: [id number] See Date of Birth: 08-Dec-1945

## 2020-08-25 ENCOUNTER — Other Ambulatory Visit: Payer: Self-pay

## 2020-08-25 ENCOUNTER — Ambulatory Visit: Payer: PPO

## 2020-08-25 DIAGNOSIS — R262 Difficulty in walking, not elsewhere classified: Secondary | ICD-10-CM

## 2020-08-25 DIAGNOSIS — M25661 Stiffness of right knee, not elsewhere classified: Secondary | ICD-10-CM

## 2020-08-25 DIAGNOSIS — R2689 Other abnormalities of gait and mobility: Secondary | ICD-10-CM

## 2020-08-25 DIAGNOSIS — M25561 Pain in right knee: Secondary | ICD-10-CM

## 2020-08-25 DIAGNOSIS — R600 Localized edema: Secondary | ICD-10-CM

## 2020-08-25 DIAGNOSIS — M6281 Muscle weakness (generalized): Secondary | ICD-10-CM

## 2020-08-25 NOTE — Therapy (Signed)
[facility] Outpatient Rehabilitation [facility] [address]  [address] [place], [place], [address] Phone: [phone]   Fax:  [phone]  Physical Therapy Treatment  Patient Details  Name: Morgan Hall MRN: [id number] Date of Birth: 05-13-1945 Referring Provider (PT): Ollen Gross, MD   Encounter Date: 08/25/2020   PT End of Session - 08/25/20 1705     Visit Number 4    Number of Visits 13    Date for PT Re-Evaluation 09/11/20    Authorization Type [employer]    PT Start Time 1618    PT Stop Time 1658    PT Time Calculation (min) 40 min    Activity Tolerance Patient tolerated treatment well;Patient limited by pain    Behavior During Therapy [facility] for tasks assessed/performed;Anxious             Past Medical History:  Diagnosis Date   Anxiety    Diabetes mellitus without complication (HCC)    Heart murmur    High cholesterol    Hypertension     Past Surgical History:  Procedure Laterality Date   colonscopy     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   Subjective Assessment - 08/25/20 1622     Subjective Pt notes she feels better but not great.    Patient is accompained by: Family member    Pertinent History R TKA 08/11/20    Patient Stated Goals "moving around like I used to before w/o a cane"    Currently in Pain? Yes    Pain Score 4     Pain Location Knee    Pain Orientation Right    Pain Descriptors / Indicators Aching    Pain Type Acute pain;Chronic pain                [facility] PT Assessment - 08/25/20 0001       Assessment   Medical Diagnosis R TKA    Referring Provider (PT) Ollen Gross, MD    Onset Date/Surgical Date 08/11/20      AROM   Right Knee Extension 15   LAQ position   Right Knee Flexion 88      Strength   Right Hip  Flexion 4-/5    Right Hip ABduction 4/5    Right Hip ADduction 4/5    Right Knee Flexion 4-/5    Right Knee Extension 3+/5                           [facility] Adult PT Treatment/Exercise - 08/25/20 0001       Knee/Hip Exercises: Aerobic   Nustep L3 x 6 min (UE/LE)      Knee/Hip Exercises: Seated   Long Arc Quad Right;AROM;20 reps      Knee/Hip Exercises: Supine   Heel Slides AAROM;Right;10 reps    Heel Slides Limitations strap assist                         PT Long Term Goals - 08/25/20 1629       PT LONG TERM GOAL #1   Title Patient will be independent with ongoing/advanced HEP for self-management at home    Status On-going      PT LONG TERM  GOAL #2   Title Decrease R knee pain by >/= 50-75% allowing patient increased ease of exercise and mobility    Status On-going   40% decrease in pain     PT LONG TERM GOAL #3   Title Patient will demonstrate R knee AROM >/= 2-120 dg to allow for normal gait and stair mechanics    Status On-going      PT LONG TERM GOAL #4   Title Patient will demonstrate improved R LE strength to >/= 4 to 4+/5 for improved stability and ease of mobility    Status On-going      PT LONG TERM GOAL #5   Title Patient to demonstrate symmetrical step length, knee flexion, and good heel-toe pattern with upright posture when ambulating with or w/o SPC or LRAD    Status On-going                   Plan - 08/25/20 1805     Clinical Impression Statement Pt demonstrates improvement with R knee AROM today (15-88 deg). She reported a 40% decrease in pain since the start of PT. Still reporting pain on the anterolateral portion of the leg and knee. She also noted pain along her R glute area with leg extension and prolonged supine positioning. R LE strength has improved but still considerable weakness globally. Pt requires lots of reassurance and instruction about progress, normal AROM ranges, and activites at home to improve  the R knee. Spent a lot of time educated her to continue using ice with R LE elevation, and exercises to strengthen the R LE going foward. She is showing progress thus far with goals. She declined GR post session today but noted that she will ice at home.    Personal Factors and Comorbidities Age;Comorbidity 3+;Fitness;Past/Current Experience;Time since onset of injury/illness/exacerbation    Comorbidities OA, anxiety, DM, HTN, HLD, heart murmur    PT Frequency 3x / week    PT Duration 4 weeks    PT Treatment/Interventions ADLs/Self Care Home Management;Cryotherapy;Electrical Stimulation;Iontophoresis 4mg /ml Dexamethasone;Moist Heat;DME Instruction;Gait training;Stair training;Functional mobility training;Therapeutic activities;Therapeutic exercise;Balance training;Neuromuscular re-education;Patient/family education;Manual techniques;Scar mobilization;Passive range of motion;Dry needling;Taping;Vasopneumatic Device;Joint Manipulations    PT Next Visit Plan progress R knee ROM and LE strengthening exercises - progress standing exercises as tolerated; gait training for normal gait pattern; manual therapy for ROM and edema management; GameReady for pain/edema management    PT Home Exercise Plan Access Code: [email or web address])    Consulted and Agree with Plan of Care Patient;Family member/caregiver    Family Member Consulted Daughter/POA - Betsy & husband - Alex             Patient will benefit from skilled therapeutic intervention in order to improve the following deficits and impairments:  Abnormal gait, Decreased activity tolerance, Decreased balance, Decreased endurance, Decreased knowledge of precautions, Decreased knowledge of use of DME, Decreased mobility, Decreased range of motion, Decreased safety awareness, Decreased scar mobility, Decreased strength, Difficulty walking, Increased edema, Increased fascial restricitons, Increased muscle spasms, Impaired perceived functional ability, Impaired  flexibility, Impaired sensation, Improper body mechanics, Postural dysfunction, Pain  Visit Diagnosis: Stiffness of right knee, not elsewhere classified  Acute pain of right knee  Other abnormalities of gait and mobility  Difficulty in walking, not elsewhere classified  Muscle weakness (generalized)  Localized edema     Problem List Patient Active Problem List   Diagnosis Date Noted   OA (osteoarthritis) of knee 08/11/2020    08/13/2020, PTA 08/25/2020, 6:21  PM  [facility] [address]  [address] [place], [place], [address] Phone: [phone]   Fax:  [phone]  Name: Morgan Hall MRN: [id number] Date of Birth: [birth year]

## 2020-08-27 ENCOUNTER — Ambulatory Visit: Payer: PPO

## 2020-08-27 ENCOUNTER — Other Ambulatory Visit: Payer: Self-pay

## 2020-08-27 DIAGNOSIS — M25661 Stiffness of right knee, not elsewhere classified: Secondary | ICD-10-CM | POA: Diagnosis not present

## 2020-08-27 DIAGNOSIS — R2689 Other abnormalities of gait and mobility: Secondary | ICD-10-CM

## 2020-08-27 DIAGNOSIS — R600 Localized edema: Secondary | ICD-10-CM

## 2020-08-27 DIAGNOSIS — R262 Difficulty in walking, not elsewhere classified: Secondary | ICD-10-CM

## 2020-08-27 DIAGNOSIS — M6281 Muscle weakness (generalized): Secondary | ICD-10-CM

## 2020-08-27 DIAGNOSIS — M25561 Pain in right knee: Secondary | ICD-10-CM

## 2020-08-27 NOTE — Therapy (Signed)
[facility] Outpatient Rehabilitation [facility] [address]  [address] [place], [place], [address] Phone: [phone]   Fax:  [phone]  Physical Therapy Treatment  Patient Details  Name: Morgan Hall MRN: [id number] Date of Birth: [birth year] Referring Provider (PT): Ollen Gross, MD   Encounter Date: 08/27/2020   PT End of Session - 08/27/20 1749     Visit Number 5    Number of Visits 13    Date for PT Re-Evaluation 09/11/20    Authorization Type [employer]    PT Start Time 1615    PT Stop Time 1655    PT Time Calculation (min) 40 min    Activity Tolerance Patient tolerated treatment well;Patient limited by pain    Behavior During Therapy [facility] for tasks assessed/performed;Anxious             Past Medical History:  Diagnosis Date   Anxiety    Diabetes mellitus without complication (HCC)    Heart murmur    High cholesterol    Hypertension     Past Surgical History:  Procedure Laterality Date   colonscopy     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   Subjective Assessment - 08/27/20 1620     Subjective Notes good report from doctor, got bandage removed and reports numbness on lateral portion of knee.    Patient is accompained by: Family member    Pertinent History R TKA 08/11/20    Patient Stated Goals "moving around like I used to before w/o a cane"    Currently in Pain? Yes    Pain Score 5     Pain Location Knee    Pain Orientation Right    Pain Descriptors / Indicators Aching    Pain Type Acute pain;Surgical pain                               [facility] Adult PT Treatment/Exercise - 08/27/20 0001       Knee/Hip Exercises: Stretches   Active Hamstring Stretch Right;2 reps;30 seconds    Active Hamstring Stretch Limitations  seated with strap      Knee/Hip Exercises: Aerobic   Nustep L4 x 6 min (UE/LE)      Knee/Hip Exercises: Standing   Hip Flexion Stengthening;Both;10 reps;Knee straight    Hip Flexion Limitations RW for support/balance    Hip Abduction Right;10 reps;AROM;Stengthening;Knee straight    Abduction Limitations RW for support/balance      Knee/Hip Exercises: Seated   Long Arc Quad AROM;Right;10 reps    Heel Slides AROM;Right;20 reps    Heel Slides Limitations foot on peanut ball    Sit to Sand 5 reps;with UE support      Knee/Hip Exercises: Supine   Quad Sets Right;10 reps;AROM;Strengthening    Quad Sets Limitations 5" hold    Straight Leg Raises Strengthening;Right;10 reps                         PT Long Term Goals - 08/25/20 1629       PT LONG TERM GOAL #1   Title Patient will be independent with ongoing/advanced HEP for self-management at home    Status On-going      PT LONG  TERM GOAL #2   Title Decrease R knee pain by >/= 50-75% allowing patient increased ease of exercise and mobility    Status On-going   40% decrease in pain     PT LONG TERM GOAL #3   Title Patient will demonstrate R knee AROM >/= 2-120 dg to allow for normal gait and stair mechanics    Status On-going      PT LONG TERM GOAL #4   Title Patient will demonstrate improved R LE strength to >/= 4 to 4+/5 for improved stability and ease of mobility    Status On-going      PT LONG TERM GOAL #5   Title Patient to demonstrate symmetrical step length, knee flexion, and good heel-toe pattern with upright posture when ambulating with or w/o SPC or LRAD    Status On-going                   Plan - 08/27/20 1646     Clinical Impression Statement Pt notes that her bandages were removed yesterday with her MD and he is pleased with her motion. Pt requires cues during hamstring stretching for proper form to target the proper muscles. She requires lots of instruction with exercises. Pt was able to  comlete STS with equal foot positioning and UE support. SLR were completed today w/o her requiring assistance but still difficulty noted with decreased ROM. She reports hs pain at times and is limited with laying in supine d/t this. Stretching relieved this. She declined GR after session but reported that she will use ice at home.    Personal Factors and Comorbidities Age;Comorbidity 3+;Fitness;Past/Current Experience;Time since onset of injury/illness/exacerbation    Comorbidities OA, anxiety, DM, HTN, HLD, heart murmur    PT Frequency 3x / week    PT Duration 4 weeks    PT Treatment/Interventions ADLs/Self Care Home Management;Cryotherapy;Electrical Stimulation;Iontophoresis 4mg /ml Dexamethasone;Moist Heat;DME Instruction;Gait training;Stair training;Functional mobility training;Therapeutic activities;Therapeutic exercise;Balance training;Neuromuscular re-education;Patient/family education;Manual techniques;Scar mobilization;Passive range of motion;Dry needling;Taping;Vasopneumatic Device;Joint Manipulations    PT Next Visit Plan progress R knee ROM and LE strengthening exercises - progress standing exercises as tolerated; gait training for normal gait pattern; manual therapy for ROM and edema management; GameReady for pain/edema management    PT Home Exercise Plan Access Code: [email or web address])    Consulted and Agree with Plan of Care Patient;Family member/caregiver    Family Member Consulted Daughter/POA - Betsy & husband - Alex             Patient will benefit from skilled therapeutic intervention in order to improve the following deficits and impairments:  Abnormal gait, Decreased activity tolerance, Decreased balance, Decreased endurance, Decreased knowledge of precautions, Decreased knowledge of use of DME, Decreased mobility, Decreased range of motion, Decreased safety awareness, Decreased scar mobility, Decreased strength, Difficulty walking, Increased edema, Increased fascial restricitons,  Increased muscle spasms, Impaired perceived functional ability, Impaired flexibility, Impaired sensation, Improper body mechanics, Postural dysfunction, Pain  Visit Diagnosis: Stiffness of right knee, not elsewhere classified  Acute pain of right knee  Other abnormalities of gait and mobility  Difficulty in walking, not elsewhere classified  Muscle weakness (generalized)  Localized edema     Problem List Patient Active Problem List   Diagnosis Date Noted   OA (osteoarthritis) of knee 08/11/2020    08/13/2020, PTA 08/27/2020, 6:16 PM  [facility] Outpatient Rehabilitation [facility] [address]  [address] [place], [place], [place] Phone: [phone]   Fax:  [phone]  Name: Morgan Hall MRN: [id number] Date of Birth: November 02, 1945

## 2020-08-28 ENCOUNTER — Ambulatory Visit: Payer: PPO | Admitting: Physical Therapy

## 2020-08-28 ENCOUNTER — Encounter: Payer: Self-pay | Admitting: Physical Therapy

## 2020-08-28 DIAGNOSIS — M6281 Muscle weakness (generalized): Secondary | ICD-10-CM

## 2020-08-28 DIAGNOSIS — R2689 Other abnormalities of gait and mobility: Secondary | ICD-10-CM

## 2020-08-28 DIAGNOSIS — R600 Localized edema: Secondary | ICD-10-CM

## 2020-08-28 DIAGNOSIS — M25561 Pain in right knee: Secondary | ICD-10-CM

## 2020-08-28 DIAGNOSIS — M25661 Stiffness of right knee, not elsewhere classified: Secondary | ICD-10-CM | POA: Diagnosis not present

## 2020-08-28 DIAGNOSIS — R262 Difficulty in walking, not elsewhere classified: Secondary | ICD-10-CM

## 2020-08-28 NOTE — Therapy (Signed)
[facility] Outpatient Rehabilitation [facility] [address]  [address] [place], [place], [address] Phone: [phone]   Fax:  [phone]  Physical Therapy Treatment  Patient Details  Name: Morgan Hall MRN: [id number] Date of Birth: [birth year] Referring Provider (PT): Ollen Gross, MD   Encounter Date: 08/28/2020   PT End of Session - 08/28/20 1615     Visit Number 6    Number of Visits 13    Date for PT Re-Evaluation 09/11/20    Authorization Type [employer]    PT Start Time 1615    PT Stop Time 1708    PT Time Calculation (min) 53 min    Activity Tolerance Patient tolerated treatment well    Behavior During Therapy [facility] for tasks assessed/performed;Anxious             Past Medical History:  Diagnosis Date   Anxiety    Diabetes mellitus without complication (HCC)    Heart murmur    High cholesterol    Hypertension     Past Surgical History:  Procedure Laterality Date   colonscopy     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   Subjective Assessment - 08/28/20 1621     Subjective Pt reports she almost cancelled today because she was hurting more but didn't want to go too long w/o PT over the holiday weekend.    Patient is accompained by: Family member    Pertinent History R TKA 08/11/20    Patient Stated Goals "moving around like I used to before w/o a cane"    Currently in Pain? Yes    Pain Score 4     Pain Location Knee    Pain Orientation Right    Pain Descriptors / Indicators Aching    Pain Type Surgical pain;Acute pain    Pain Frequency Constant                [facility] PT Assessment - 08/28/20 1615       Assessment   Medical Diagnosis R TKA    Referring Provider (PT) Ollen Gross, MD    Onset Date/Surgical Date 08/11/20    Next MD  Visit 09/16/20                           [facility] Adult PT Treatment/Exercise - 08/28/20 1615       Ambulation/Gait   Ambulation/Gait Assistance 5: Supervision    Ambulation/Gait Assistance Details cues for proper sequencing with SPC as well as increased hip and knee flexion with heel strike on wieght acceptance for improved foot clearance    Ambulation Distance (Feet) 180 Feet    Assistive device Straight cane    Gait Pattern Step-through pattern;Decreased step length - right;Decreased weight shift to right;Decreased stance time - right;Decreased hip/knee flexion - right;Poor foot clearance - right      Knee/Hip Exercises: Aerobic   Nustep L4 x 6 min (UE/LE)      Knee/Hip Exercises: Standing   Heel Raises Both;10 reps;3 seconds    Heel Raises Limitations UE support on counter    Hip Flexion Right;Left;10 reps;Knee straight;Stengthening    Hip Flexion Limitations 2# - RW for support/balance    Hip Abduction Right;Left;10 reps;Knee straight;Stengthening    Abduction Limitations 2# -  RW for support/balance    Hip Extension Right;Left;10 reps;Knee straight;Stengthening    Extension Limitations 2# - RW for support/balance    Functional Squat 10 reps;3 seconds    Functional Squat Limitations counter squat - cues for posterior weight shift avoiding knees fwd of toes      Knee/Hip Exercises: Seated   Long Arc Quad Right;10 reps;Strengthening;Weights    Long Arc Quad Weight 2 lbs.    Other Seated Knee/Hip Exercises R Fitter leg press (1 black) x 10      Modalities   Modalities Vasopneumatic;Moist Heat      Moist Heat Therapy   Number Minutes Moist Heat 8 Minutes    Moist Heat Location Hip      Vasopneumatic   Number Minutes Vasopneumatic  8 minutes    Vasopnuematic Location  Knee   Rt   Vasopneumatic Pressure Low    Vasopneumatic Temperature  34      Manual Therapy   Manual Therapy Soft tissue mobilization;Myofascial release    Soft tissue mobilization STM/DTM to  area of tenderness in glutes/piriformis    Myofascial Release manual TPR to R glutes/piriformis                         PT Long Term Goals - 08/25/20 1629       PT LONG TERM GOAL #1   Title Patient will be independent with ongoing/advanced HEP for self-management at home    Status On-going      PT LONG TERM GOAL #2   Title Decrease R knee pain by >/= 50-75% allowing patient increased ease of exercise and mobility    Status On-going   40% decrease in pain     PT LONG TERM GOAL #3   Title Patient will demonstrate R knee AROM >/= 2-120 dg to allow for normal gait and stair mechanics    Status On-going      PT LONG TERM GOAL #4   Title Patient will demonstrate improved R LE strength to >/= 4 to 4+/5 for improved stability and ease of mobility    Status On-going      PT LONG TERM GOAL #5   Title Patient to demonstrate symmetrical step length, knee flexion, and good heel-toe pattern with upright posture when ambulating with or w/o SPC or LRAD    Status On-going                   Plan - 08/28/20 1702     Clinical Impression Statement Morgan Hall reports her surgeon states her knee is progressing as expected for 2 weeks post-op but she continues to feel limited due to pain and decreased activity tolerance. She is demonstrating decreasing reliance on the RW with ambulation, therefore initiated gait training with SPC. She was able to do a fairly good job with sequencing the cane but initially demonstrated decreased R foot clearance and step length - better after cues for increased hip and knee flexion with targeted heel strike but R stride length still less than L. Progressed LE strengthening with addition of 2# cuff weights and initiation of squats and heel raises in standing but tolerance limited due to ongoing c/o R proximal thigh/hip pain and muscle tightness - addressed with manual STM/TPR but limited tolerance, therefore session concluded with moist heat to hip area  to promote further muscle relaxation while vaso applied to knee to reduce post-exercise pain and edema.    Comorbidities OA, anxiety, DM, HTN,  HLD, heart murmur    Rehab Potential Good    PT Frequency 3x / week    PT Duration 4 weeks    PT Treatment/Interventions ADLs/Self Care Home Management;Cryotherapy;Electrical Stimulation;Iontophoresis 4mg /ml Dexamethasone;Moist Heat;DME Instruction;Gait training;Stair training;Functional mobility training;Therapeutic activities;Therapeutic exercise;Balance training;Neuromuscular re-education;Patient/family education;Manual techniques;Scar mobilization;Passive range of motion;Dry needling;Taping;Vasopneumatic Device;Joint Manipulations    PT Next Visit Plan progress R knee ROM and LE strengthening exercises - progress standing exercises as tolerated; gait training for normal gait pattern & continued weaning from RW to [facility]; manual therapy for ROM and edema management; GameReady for pain/edema management    PT Home Exercise Plan Access Code: [facility] (6/16)    Consulted and Agree with Plan of Care Patient;Family member/caregiver    Family Member Consulted husband - Morgan Hall             Patient will benefit from skilled therapeutic intervention in order to improve the following deficits and impairments:  Abnormal gait, Decreased activity tolerance, Decreased balance, Decreased endurance, Decreased knowledge of precautions, Decreased knowledge of use of DME, Decreased mobility, Decreased range of motion, Decreased safety awareness, Decreased scar mobility, Decreased strength, Difficulty walking, Increased edema, Increased fascial restricitons, Increased muscle spasms, Impaired perceived functional ability, Impaired flexibility, Impaired sensation, Improper body mechanics, Postural dysfunction, Pain  Visit Diagnosis: Stiffness of right knee, not elsewhere classified  Acute pain of right knee  Other abnormalities of gait and mobility  Difficulty in walking, not  elsewhere classified  Muscle weakness (generalized)  Localized edema     Problem List Patient Active Problem List   Diagnosis Date Noted   OA (osteoarthritis) of knee 08/11/2020    08/13/2020, PT, MPT 08/28/2020, 7:38 PM  [facility] Outpatient Rehabilitation [facility] [address]  [address] [place], [place], [place] Phone: [phone]   Fax:  [phone]  Name: Morgan Hall MRN: [id number] See Date of Birth: 02/20/1946

## 2020-09-02 ENCOUNTER — Encounter: Payer: Self-pay | Admitting: Physical Therapy

## 2020-09-03 ENCOUNTER — Ambulatory Visit: Payer: PPO | Attending: Orthopedic Surgery

## 2020-09-03 ENCOUNTER — Other Ambulatory Visit: Payer: Self-pay

## 2020-09-03 DIAGNOSIS — M6281 Muscle weakness (generalized): Secondary | ICD-10-CM | POA: Insufficient documentation

## 2020-09-03 DIAGNOSIS — R2689 Other abnormalities of gait and mobility: Secondary | ICD-10-CM | POA: Diagnosis not present

## 2020-09-03 DIAGNOSIS — R262 Difficulty in walking, not elsewhere classified: Secondary | ICD-10-CM | POA: Diagnosis not present

## 2020-09-03 DIAGNOSIS — M25561 Pain in right knee: Secondary | ICD-10-CM | POA: Insufficient documentation

## 2020-09-03 DIAGNOSIS — M25661 Stiffness of right knee, not elsewhere classified: Secondary | ICD-10-CM | POA: Insufficient documentation

## 2020-09-03 DIAGNOSIS — R600 Localized edema: Secondary | ICD-10-CM | POA: Insufficient documentation

## 2020-09-03 NOTE — Therapy (Signed)
[facility] Outpatient Rehabilitation [facility] [address]  [address] [place], [place], [address] Phone: [phone]   Fax:  [phone]  Physical Therapy Treatment  Patient Details  Name: Morgan Hall MRN: [id number] Date of Birth: Feb 18, 1946 Referring Provider (PT): Ollen Gross, MD   Encounter Date: 09/03/2020   PT End of Session - 09/03/20 1712     Visit Number 7    Number of Visits 13    Date for PT Re-Evaluation 09/11/20    Authorization Type [employer]    PT Start Time 1615    PT Stop Time 1702    PT Time Calculation (min) 47 min    Activity Tolerance Patient tolerated treatment well    Behavior During Therapy [facility] for tasks assessed/performed;Anxious             Past Medical History:  Diagnosis Date   Anxiety    Diabetes mellitus without complication (HCC)    Heart murmur    High cholesterol    Hypertension     Past Surgical History:  Procedure Laterality Date   colonscopy     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   Subjective Assessment - 09/03/20 1618     Subjective Pt is doing good. Notes that she does a lot of walking at home.    Patient is accompained by: Family member    Pertinent History R TKA 08/11/20    Patient Stated Goals "moving around like I used to before w/o a cane"    Currently in Pain? Yes    Pain Score 4     Pain Location Knee    Pain Orientation Right    Pain Descriptors / Indicators Aching    Pain Type Acute pain;Surgical pain                [facility] PT Assessment - 09/03/20 0001       AROM   Right Knee Flexion 95                           [facility] Adult PT Treatment/Exercise - 09/03/20 0001       Ambulation/Gait   Ambulation/Gait Assistance 5: Supervision    Ambulation Distance (Feet) 180  Feet    Assistive device Straight cane    Gait Pattern Step-through pattern;Decreased step length - right;Decreased weight shift to right;Decreased stance time - right;Decreased hip/knee flexion - right;Poor foot clearance - right    Gait Comments cues for increasing step length      Knee/Hip Exercises: Aerobic   Nustep L4 x 6 min (UE/LE)      Knee/Hip Exercises: Standing   Forward Step Up Right;Hand Hold: 2;Step Height: 4";15 reps    Functional Squat 3 seconds;15 reps      Knee/Hip Exercises: Seated   Long Arc Quad Strengthening;Right;15 reps    Long Arc Quad Weight 2 lbs.    Heel Slides AAROM;Right;10 reps    Heel Slides Limitations foot on peanut ball and strap    Other Seated Knee/Hip Exercises R leg extension stretch with leg propped on chair 1 min hold    Sit to Sand 10 reps;with UE support   bottom touch elevated mat table     Modalities   Modalities --  PT Long Term Goals - 09/03/20 1821       PT LONG TERM GOAL #1   Title Patient will be independent with ongoing/advanced HEP for self-management at home    Status On-going      PT LONG TERM GOAL #2   Title Decrease R knee pain by >/= 50-75% allowing patient increased ease of exercise and mobility    Status On-going   40% decrease in pain     PT LONG TERM GOAL #3   Title Patient will demonstrate R knee AROM >/= 2-120 dg to allow for normal gait and stair mechanics    Status On-going      PT LONG TERM GOAL #4   Title Patient will demonstrate improved R LE strength to >/= 4 to 4+/5 for improved stability and ease of mobility    Status On-going      PT LONG TERM GOAL #5   Title Patient to demonstrate symmetrical step length, knee flexion, and good heel-toe pattern with upright posture when ambulating with or w/o SPC or LRAD    Status On-going   decreased knee flexion, heel strike, and step length with [facility]                  Plan - 09/03/20 1825     Clinical Impression  Statement Initiated step ups with patient today on 4' step with B UE support. Also did gait training again with SPC with her demonstrating decreased stride length. Continued with squats utilizing hand support and requiring cues to adequately shift posterior weight to prevent knees over toes. Pt inquires a lot about recovery, time frames to restore normal function, and normal ROM values during session so education was given on these things. She declines GR post session due to the machine being too cold her but she reported that she uses her ice at home. She is slowly progressing toward LTG 5.    Personal Factors and Comorbidities Age;Comorbidity 3+;Fitness;Past/Current Experience;Time since onset of injury/illness/exacerbation    Comorbidities OA, anxiety, DM, HTN, HLD, heart murmur    PT Frequency 3x / week    PT Duration 4 weeks    PT Treatment/Interventions ADLs/Self Care Home Management;Cryotherapy;Electrical Stimulation;Iontophoresis 4mg /ml Dexamethasone;Moist Heat;DME Instruction;Gait training;Stair training;Functional mobility training;Therapeutic activities;Therapeutic exercise;Balance training;Neuromuscular re-education;Patient/family education;Manual techniques;Scar mobilization;Passive range of motion;Dry needling;Taping;Vasopneumatic Device;Joint Manipulations    PT Next Visit Plan progress R knee ROM and LE strengthening exercises - progress standing exercises as tolerated; gait training for normal gait pattern & continued weaning from RW to [facility]; manual therapy for ROM and edema management; GameReady for pain/edema management    PT Home Exercise Plan Access Code: [facility] (6/16)    Consulted and Agree with Plan of Care Patient;Family member/caregiver    Family Member Consulted husband - Alex             Patient will benefit from skilled therapeutic intervention in order to improve the following deficits and impairments:  Abnormal gait, Decreased activity tolerance, Decreased balance,  Decreased endurance, Decreased knowledge of precautions, Decreased knowledge of use of DME, Decreased mobility, Decreased range of motion, Decreased safety awareness, Decreased scar mobility, Decreased strength, Difficulty walking, Increased edema, Increased fascial restricitons, Increased muscle spasms, Impaired perceived functional ability, Impaired flexibility, Impaired sensation, Improper body mechanics, Postural dysfunction, Pain  Visit Diagnosis: Stiffness of right knee, not elsewhere classified  Acute pain of right knee  Other abnormalities of gait and mobility  Difficulty in walking, not elsewhere classified  Muscle weakness (generalized)  Localized  edema     Problem List Patient Active Problem List   Diagnosis Date Noted   OA (osteoarthritis) of knee 08/11/2020    Darleene Cleaver, PTA 09/03/2020, 6:32 PM  [facility] [address]  [address] [place], [place], [address] Phone: [phone]   Fax:  [phone]  Name: Doral Digangi MRN: [id number] Date of Birth: 11-Feb-1946

## 2020-09-04 ENCOUNTER — Ambulatory Visit: Payer: PPO | Admitting: Physical Therapy

## 2020-09-04 ENCOUNTER — Encounter: Payer: Self-pay | Admitting: Physical Therapy

## 2020-09-04 DIAGNOSIS — M25661 Stiffness of right knee, not elsewhere classified: Secondary | ICD-10-CM | POA: Diagnosis not present

## 2020-09-04 DIAGNOSIS — R262 Difficulty in walking, not elsewhere classified: Secondary | ICD-10-CM

## 2020-09-04 DIAGNOSIS — R2689 Other abnormalities of gait and mobility: Secondary | ICD-10-CM

## 2020-09-04 DIAGNOSIS — R600 Localized edema: Secondary | ICD-10-CM

## 2020-09-04 DIAGNOSIS — M25561 Pain in right knee: Secondary | ICD-10-CM

## 2020-09-04 DIAGNOSIS — M6281 Muscle weakness (generalized): Secondary | ICD-10-CM

## 2020-09-04 NOTE — Therapy (Signed)
[facility] Rehabilitation [facility] [address]  [address] [place], [place], [address] Phone: [phone]   Fax:  [phone]  Physical Therapy Treatment  Patient Details  Name: Morgan Hall MRN: [id number] Date of Birth: [birth year] Referring Provider (PT): Ollen Gross, MD   Encounter Date: 09/04/2020   PT End of Session - 09/04/20 1615     Visit Number 8    Number of Visits 13    Date for PT Re-Evaluation 09/11/20    Authorization Type [employer]    PT Start Time 1615    PT Stop Time 1707    PT Time Calculation (min) 52 min    Activity Tolerance Patient tolerated treatment well    Behavior During Therapy [facility] for tasks assessed/performed;Anxious             Past Medical History:  Diagnosis Date   Anxiety    Diabetes mellitus without complication (HCC)    Heart murmur    High cholesterol    Hypertension     Past Surgical History:  Procedure Laterality Date   colonscopy     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   Subjective Assessment - 09/04/20 1618     Subjective Pt reports she missed her last visit due to high blood pressure and not feeling well. She states she is feeling better today and her BP was okay when she checked it at home - she refused PT offer to check BP.    Patient is accompained by: Family member    Pertinent History R TKA 08/11/20    Patient Stated Goals "moving around like I used to before w/o a cane"    Currently in Pain? Yes    Pain Score 4     Pain Location Knee    Pain Orientation Right;Posterior    Pain Descriptors / Indicators Tightness;Squeezing    Pain Type Surgical pain;Acute pain    Pain Frequency Constant                               [facility] Adult PT Treatment/Exercise - 09/04/20 1615        Ambulation/Gait   Ambulation/Gait Assistance 5: Supervision    Ambulation Distance (Feet) 180 Feet    Assistive device Straight cane    Gait Pattern Step-through pattern;Decreased step length - right;Decreased weight shift to right;Decreased stance time - right;Decreased hip/knee flexion - right;Poor foot clearance - right    Gait Comments pt encouraged to try use cane when going out to familiar locations with limited walking (such as PT visits) but continue to use RW if having to walk for longer periods or going to unfamiliar places      Exercises   Exercises Knee/Hip      Knee/Hip Exercises: Stretches   Active Hamstring Stretch Right;2 reps;30 seconds    Active Hamstring Stretch Limitations seated hip hinge      Knee/Hip Exercises: Aerobic   Nustep L4 x 6 min (UE/LE)      Knee/Hip Exercises: Supine   Straight Leg Raises Right;10 reps;Strengthening    Straight Leg Raises Limitations cues for quad set prior to initiation of lift - AAROM necessary for 1st few reps but pt    Knee Extension Right;10  reps;Strengthening    Knee Extension Limitations quad set + hip extension into peanut ball    Knee Flexion Both;2 sets;10 reps;AROM;AAROM    Knee Flexion Limitations HS curls with heels on peanut ball - 2nd set with light PT overpressure for flexion stretch      Vasopneumatic   Number Minutes Vasopneumatic  10 minutes   seated with R LE elevated on 9" stool + 2 Airex pads   Vasopnuematic Location  Knee   Rt   Vasopneumatic Pressure Medium    Vasopneumatic Temperature  34      Manual Therapy   Manual Therapy Joint mobilization;Soft tissue mobilization;Edema management;Passive ROM    Edema Management retrograde massage to R lower leg and knee for edema management    Joint Mobilization R patellar mobs - all directions    Passive ROM gravity assisted knee flexion with PT assist/overpressure - hip at 90 flexion                         PT Long Term Goals - 09/03/20  1821       PT LONG TERM GOAL #1   Title Patient will be independent with ongoing/advanced HEP for self-management at home    Status On-going      PT LONG TERM GOAL #2   Title Decrease R knee pain by >/= 50-75% allowing patient increased ease of exercise and mobility    Status On-going   40% decrease in pain     PT LONG TERM GOAL #3   Title Patient will demonstrate R knee AROM >/= 2-120 dg to allow for normal gait and stair mechanics    Status On-going      PT LONG TERM GOAL #4   Title Patient will demonstrate improved R LE strength to >/= 4 to 4+/5 for improved stability and ease of mobility    Status On-going      PT LONG TERM GOAL #5   Title Patient to demonstrate symmetrical step length, knee flexion, and good heel-toe pattern with upright posture when ambulating with or w/o SPC or LRAD    Status On-going   decreased knee flexion, heel strike, and step length with [facility]                  Plan - 09/04/20 1622     Clinical Impression Statement Bunny reports swelling/edema in her knee making her very uncomfortable. When inquired about MD prescribing support hose, she states she has not been wearing them because they were too tight and cutting off circulation. Verbally explained that hose are intentionally snug but as long as they are applied with roll or folds, they are unlikely to cut off circulation - pt encouraged to try hose again and if uncertain bring hose with her to next session. Therapeutic activities incorporating manual therapy and exercises to address edema management and patellar mobility to allow for improved knee ROM, as well as strength for improved stability and control. Session concluded with GR vasopnuematic compression to promote further edema reduction and pain control.    Comorbidities OA, anxiety, DM, HTN, HLD, heart murmur    Rehab Potential Good    PT Frequency 3x / week    PT Duration 4 weeks    PT Treatment/Interventions ADLs/Self Care Home  Management;Cryotherapy;Electrical Stimulation;Iontophoresis 4mg /ml Dexamethasone;Moist Heat;DME Instruction;Gait training;Stair training;Functional mobility training;Therapeutic activities;Therapeutic exercise;Balance training;Neuromuscular re-education;Patient/family education;Manual techniques;Scar mobilization;Passive range of motion;Dry needling;Taping;Vasopneumatic Device;Joint Manipulations    PT Next Visit Plan progress  R knee ROM and LE strengthening exercises - progress standing exercises as tolerated; gait training for normal gait pattern & continued weaning from RW to [facility]; manual therapy for ROM and edema management; GameReady for pain/edema management    PT Home Exercise Plan Access Code: [email or web address] (6/16)    Consulted and Agree with Plan of Care Patient;Family member/caregiver    Family Member Consulted husband - Alex             Patient will benefit from skilled therapeutic intervention in order to improve the following deficits and impairments:  Abnormal gait, Decreased activity tolerance, Decreased balance, Decreased endurance, Decreased knowledge of precautions, Decreased knowledge of use of DME, Decreased mobility, Decreased range of motion, Decreased safety awareness, Decreased scar mobility, Decreased strength, Difficulty walking, Increased edema, Increased fascial restricitons, Increased muscle spasms, Impaired perceived functional ability, Impaired flexibility, Impaired sensation, Improper body mechanics, Postural dysfunction, Pain  Visit Diagnosis: Stiffness of right knee, not elsewhere classified  Acute pain of right knee  Other abnormalities of gait and mobility  Difficulty in walking, not elsewhere classified  Muscle weakness (generalized)  Localized edema     Problem List Patient Active Problem List   Diagnosis Date Noted   OA (osteoarthritis) of knee 08/11/2020    Marry Guan, PT, MPT 09/04/2020, 6:50 PM  [facility] Rehabilitation  [facility] [address]  [address] [place], [place], [address] Phone: [phone]   Fax:  [phone]  Name: Morgan Hall MRN: [id number] Date of Birth: [birth year]

## 2020-09-08 ENCOUNTER — Ambulatory Visit: Payer: PPO

## 2020-09-08 ENCOUNTER — Other Ambulatory Visit: Payer: Self-pay

## 2020-09-08 DIAGNOSIS — M25661 Stiffness of right knee, not elsewhere classified: Secondary | ICD-10-CM | POA: Diagnosis not present

## 2020-09-08 DIAGNOSIS — M6281 Muscle weakness (generalized): Secondary | ICD-10-CM

## 2020-09-08 DIAGNOSIS — R2689 Other abnormalities of gait and mobility: Secondary | ICD-10-CM

## 2020-09-08 DIAGNOSIS — R600 Localized edema: Secondary | ICD-10-CM

## 2020-09-08 DIAGNOSIS — M25561 Pain in right knee: Secondary | ICD-10-CM

## 2020-09-08 DIAGNOSIS — R262 Difficulty in walking, not elsewhere classified: Secondary | ICD-10-CM

## 2020-09-08 NOTE — Therapy (Signed)
[facility] Outpatient Rehabilitation [facility] [address]  [address] [place], [place], [address] Phone: [phone]   Fax:  [phone]  Physical Therapy Treatment  Patient Details  Name: Morgan Hall MRN: [id number] Date of Birth: [birth year] Referring Provider (PT): Ollen Gross, MD   Encounter Date: 09/08/2020   PT End of Session - 09/08/20 1659     Visit Number 9    Number of Visits 13    Date for PT Re-Evaluation 09/11/20    Authorization Type [employer]    PT Start Time 1611    PT Stop Time 1654    PT Time Calculation (min) 43 min    Activity Tolerance Patient tolerated treatment well;Patient limited by pain    Behavior During Therapy [facility] for tasks assessed/performed;Anxious             Past Medical History:  Diagnosis Date   Anxiety    Diabetes mellitus without complication (HCC)    Heart murmur    High cholesterol    Hypertension     Past Surgical History:  Procedure Laterality Date   colonscopy     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   Subjective Assessment - 09/08/20 1614     Subjective Reports soreness in her distal HS and lat quads mostly.    Patient is accompained by: Family member    Pertinent History R TKA 08/11/20    Patient Stated Goals "moving around like I used to before w/o a cane"    Currently in Pain? No/denies                               [facility] Adult PT Treatment/Exercise - 09/08/20 0001       Exercises   Exercises Knee/Hip      Knee/Hip Exercises: Stretches   Active Hamstring Stretch Right;2 reps;30 seconds    Active Hamstring Stretch Limitations seated hip hinge with strap      Knee/Hip Exercises: Aerobic   Nustep L5 x 6 min (UE/LE)      Knee/Hip Exercises: Standing   Lateral Step Up  Right;10 reps;Hand Hold: 2;Step Height: 6"    Forward Step Up Right;10 reps;Hand Hold: 2;Step Height: 6"    Forward Step Up Limitations cues for TKE at top    Functional Squat 10 reps    Functional Squat Limitations cues for proper post WS      Knee/Hip Exercises: Seated   Heel Slides AAROM;Right;10 reps    Heel Slides Limitations foot on peanut ball and strap      Vasopneumatic   Number Minutes Vasopneumatic  --    Vasopnuematic Location  --    Vasopneumatic Pressure --    Vasopneumatic Temperature  --      Manual Therapy   Manual Therapy Soft tissue mobilization;Myofascial release;Passive ROM    Soft tissue mobilization STM to distal HS, quads    Myofascial Release manual TPR to R distal HS    Passive ROM gentle ext and flexion                         PT Long Term Goals - 09/03/20 1821       PT LONG TERM GOAL #1  Title Patient will be independent with ongoing/advanced HEP for self-management at home    Status On-going      PT LONG TERM GOAL #2   Title Decrease R knee pain by >/= 50-75% allowing patient increased ease of exercise and mobility    Status On-going   40% decrease in pain     PT LONG TERM GOAL #3   Title Patient will demonstrate R knee AROM >/= 2-120 dg to allow for normal gait and stair mechanics    Status On-going      PT LONG TERM GOAL #4   Title Patient will demonstrate improved R LE strength to >/= 4 to 4+/5 for improved stability and ease of mobility    Status On-going      PT LONG TERM GOAL #5   Title Patient to demonstrate symmetrical step length, knee flexion, and good heel-toe pattern with upright posture when ambulating with or w/o SPC or LRAD    Status On-going   decreased knee flexion, heel strike, and step length with [facility]                  Plan - 09/08/20 1707     Clinical Impression Statement Pt ambulated into clinic with SPC. She still demonstrates shortened steps, requiring cues to increase step length. She  forgot to bring her ted hose today but plans to bring them next visit. We did some STM to the quads and HS today due to her reporting soreness along these areas. She noted improvement with soreness post MT. She tolerated the exercises well but did report mild pain with step ups. She continues needing cues to shift enough weight posteriorly to prevent excessive stress on the knees. We tried to end session with vaso but she was unable to do more than 4 min due to the extreme cold and compression.    Personal Factors and Comorbidities Age;Comorbidity 3+;Fitness;Past/Current Experience;Time since onset of injury/illness/exacerbation    Comorbidities OA, anxiety, DM, HTN, HLD, heart murmur    PT Frequency 3x / week    PT Duration 4 weeks    PT Treatment/Interventions ADLs/Self Care Home Management;Cryotherapy;Electrical Stimulation;Iontophoresis 4mg /ml Dexamethasone;Moist Heat;DME Instruction;Gait training;Stair training;Functional mobility training;Therapeutic activities;Therapeutic exercise;Balance training;Neuromuscular re-education;Patient/family education;Manual techniques;Scar mobilization;Passive range of motion;Dry needling;Taping;Vasopneumatic Device;Joint Manipulations    PT Next Visit Plan progress R knee ROM and LE strengthening exercises - progress standing exercises as tolerated; gait training for normal gait pattern & continued weaning from RW to [facility]; manual therapy for ROM and edema management; GameReady for pain/edema management    PT Home Exercise Plan Access Code: [facility] (6/16)    Consulted and Agree with Plan of Care Patient;Family member/caregiver    Family Member Consulted husband - Morgan Hall             Patient will benefit from skilled therapeutic intervention in order to improve the following deficits and impairments:  Abnormal gait, Decreased activity tolerance, Decreased balance, Decreased endurance, Decreased knowledge of precautions, Decreased knowledge of use of DME, Decreased  mobility, Decreased range of motion, Decreased safety awareness, Decreased scar mobility, Decreased strength, Difficulty walking, Increased edema, Increased fascial restricitons, Increased muscle spasms, Impaired perceived functional ability, Impaired flexibility, Impaired sensation, Improper body mechanics, Postural dysfunction, Pain  Visit Diagnosis: Stiffness of right knee, not elsewhere classified  Acute pain of right knee  Other abnormalities of gait and mobility  Difficulty in walking, not elsewhere classified  Muscle weakness (generalized)  Localized edema     Problem List Patient Active Problem  List   Diagnosis Date Noted   OA (osteoarthritis) of knee 08/11/2020    Darleene Cleaver, PTA 09/08/2020, 5:21 PM  [facility] [address]  [address] [place], [place], [address] Phone: [phone]   Fax:  [phone]  Name: Morgan Hall MRN: [id number] Date of Birth: 1945/04/09

## 2020-09-09 ENCOUNTER — Encounter: Payer: Self-pay | Admitting: Physical Therapy

## 2020-09-09 ENCOUNTER — Ambulatory Visit: Payer: PPO | Admitting: Physical Therapy

## 2020-09-09 DIAGNOSIS — R262 Difficulty in walking, not elsewhere classified: Secondary | ICD-10-CM

## 2020-09-09 DIAGNOSIS — M25661 Stiffness of right knee, not elsewhere classified: Secondary | ICD-10-CM | POA: Diagnosis not present

## 2020-09-09 DIAGNOSIS — M6281 Muscle weakness (generalized): Secondary | ICD-10-CM

## 2020-09-09 DIAGNOSIS — R2689 Other abnormalities of gait and mobility: Secondary | ICD-10-CM

## 2020-09-09 DIAGNOSIS — M25561 Pain in right knee: Secondary | ICD-10-CM

## 2020-09-09 DIAGNOSIS — R600 Localized edema: Secondary | ICD-10-CM

## 2020-09-09 NOTE — Therapy (Signed)
[facility] [address]  [place], [place], [address] Phone: [phone]   Fax:  [phone]  Physical Therapy Treatment / Progress Note / Recert  Patient Details  Name: Morgan Hall MRN: [id number] Date of Birth: [birth year] Referring Provider (PT): Gaynelle Arabian, MD  Progress Note  Reporting Period 08/14/2020 to 09/09/2020  See note below for Objective Data and Assessment of Progress/Goals.     Encounter Date: 09/09/2020   PT End of Session - 09/09/20 1623     Visit Number 10    Number of Visits 18    Date for PT Re-Evaluation 10/07/20    Authorization Type [employer]    PT Start Time 1623    PT Stop Time 1712    PT Time Calculation (min) 49 min    Activity Tolerance Patient tolerated treatment well;Patient limited by pain    Behavior During Therapy [facility] for tasks assessed/performed;Anxious             Past Medical History:  Diagnosis Date   Anxiety    Diabetes mellitus without complication ([place])    Heart murmur    High cholesterol    Hypertension     Past Surgical History:  Procedure Laterality Date   colonscopy     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   Subjective Assessment - 09/09/20 1632     Subjective Pt brought her compression socks with her today for PT to check out tension/pressure. She feels like she needs more PT at this point.    Patient is accompained by: Family member    Pertinent History R TKA 08/11/20    Patient Stated Goals "moving around like I used to before w/o a cane"    Currently in Pain? Yes    Pain Score 4     Pain Location Knee    Pain Orientation Right    Pain Descriptors / Indicators Tightness;Squeezing    Pain Type Surgical pain;Acute pain    Pain Score 6   occasional  sharp pain w/o known trigger   Pain Location Knee    Pain Orientation Left    Pain Descriptors / Indicators Sharp    Pain Type Chronic pain    Pain Frequency Occasional                [facility] PT Assessment - 09/09/20 1623       Assessment   Medical Diagnosis R TKA    Referring Provider (PT) FGaynelle Arabian MD    Onset Date/Surgical Date 08/11/20    Next MD Visit 09/16/20      AROM   Right Knee Extension 8   in LAQ & supported after joint mobs   Right Knee Flexion 104      Strength   Overall Strength Comments tested in sitting    Right Hip Flexion 4/5    Right Hip Extension 4/5    Right Hip ABduction 4/5    Right Hip ADduction 4-/5    Left Hip Flexion 4+/5    Left Hip Extension 4/5    Left Hip ABduction 4+/5    Left Hip ADduction 4+/5    Right Knee Flexion 4/5    Right Knee Extension 4/5    Left Knee Flexion 5/5    Left  Knee Extension 4+/5    Right Ankle Dorsiflexion 4+/5    Left Ankle Dorsiflexion 5/5                           [facility] Adult PT Treatment/Exercise - 09/09/20 1623       Ambulation/Gait   Ambulation/Gait Assistance 5: Supervision    Ambulation Distance (Feet) 180 Feet    Assistive device Straight cane    Gait Pattern Step-through pattern;Decreased step length - right;Decreased weight shift to right;Decreased stance time - right;Decreased hip/knee flexion - right;Poor foot clearance - right    Gait Comments Pt with decreased knee extension and lacking heel strike on initial contact with tendency to slide R foot forward unless cued for R foot clearance and heel strike.      Exercises   Exercises Knee/Hip      Knee/Hip Exercises: Aerobic   Nustep L5 x 6 min (UE/LE)      Knee/Hip Exercises: Machines for Strengthening   Cybex Knee Extension B LE 5# 2 x 10    Cybex Knee Flexion B LE 5# 2 x 10      Knee/Hip Exercises: Standing   Terminal Knee Extension Right;10 reps;2 sets;Strengthening    Terminal Knee Extension Limitations TKE with  ball on wall      Vasopneumatic   Number Minutes Vasopneumatic  5 minutes   seated with R LE elevated on 9" stool + 2 Airex pads   Vasopnuematic Location  Knee   Rt   Vasopneumatic Pressure Medium    Vasopneumatic Temperature  34      Manual Therapy   Manual Therapy Joint mobilization;Soft tissue mobilization;Edema management;Passive ROM    Edema Management retrograde massage to R lower leg and knee for edema management    Joint Mobilization R patellar mobs - multi-directional, focused on knee ext; APs femur with tibia block grade 3-4 with superior patellar mobs; end range knee flexion with tib on fem mobs grade 4    Passive ROM gravity assisted knee flexion with PT assist/overpressure - hip at 90 flexion                         PT Long Term Goals - 09/09/20 1652       PT LONG TERM GOAL #1   Title Patient will be independent with ongoing/advanced HEP for self-management at home    Status Partially Met   09/09/20 - met for current HEP   Target Date 10/07/20      PT LONG TERM GOAL #2   Title Decrease R knee pain by >/= 50-75% allowing patient increased ease of exercise and mobility    Status On-going   09/09/20 - 40% decrease in pain   Target Date 10/07/20      PT LONG TERM GOAL #3   Title Patient will demonstrate R knee AROM >/= 2-120 dg to allow for normal gait and stair mechanics    Status On-going    Target Date 10/07/20      PT LONG TERM GOAL #4   Title Patient will demonstrate improved R LE strength to >/= 4 to 4+/5 for improved stability and ease of mobility    Status On-going    Target Date 10/07/20      PT LONG TERM GOAL #5   Title Patient to demonstrate symmetrical step length, knee flexion, and good heel-toe pattern with upright posture when ambulating with or w/o SPC  or LRAD    Status On-going   09/09/20 - decreased knee extension and lacking heel strike on initial contact with tendency to slide R foot forward with decreased hip and knee flexion during  swing phase and decreased R foot clearance unless cued for R foot clearance and heel strike   Target Date 10/07/20                   Plan - 09/09/20 1712     Clinical Impression Statement Lavora is progressing with PT 4 weeks s/p R TKA on 08/11/20. She continues to be concerned about the post-op edema but has not been wearing the compression hose as recommended due c/o "cutting off her circulation" - she remembered to bring the hose with her to PT today and when PT donned hose, fit was good w/o excessive pressure at any level. She was able to wear the hose throughout the session w/o issues. Encouraged her to don the hose in the morning upon rising and wear when active throughout the day to prevent edema from building as the day progresses. Pain is improving and mostly related to tightness from edema but pt also noting continue proximal quad and HS muscle soreness/TPs. Her R knee AROM is currently 8-104. Her overall LE strength is improving but continued proximal weakness and decreased R VMO activation with decreased R eccentric quad control still evident. She has weaned from the RW to a cane but demonstrates decreased knee extension and lacking heel strike on initial contact with tendency to slide R foot forward with decreased hip and knee flexion and decreased R foot clearance unless cued for R foot clearance and heel strike. Stairs not yet attempted due to focus on gait pattern with ambulation and limited exercise/activity tolerance during sessions. Progress observed toward LTG but majority of goals still ongoing. Jaylah will continue to benefit from skilled PT to maximize functional ROM in R knee and restore normal proximal LE strength and L quad/VMO to normalize gait pattern and improve balance and gait stability as well as safety with stair negotiation, therefore will recommend recertification for additional 2x/wk for up to 4 wks.    Personal Factors and Comorbidities Age;Comorbidity  3+;Fitness;Past/Current Experience;Time since onset of injury/illness/exacerbation    Comorbidities OA, anxiety, DM, HTN, HLD, heart murmur    Examination-Activity Limitations Bathing;Bed Mobility;Bend;Caring for Others;Carry;Dressing;Hygiene/Grooming;Lift;Locomotion Level;Sit;Squat;Stairs;Stand;Toileting;Transfers    Examination-Participation Restrictions Cleaning;Community Activity;Driving;Laundry;Medication Management;Meal Prep;Shop    Rehab Potential Good    PT Frequency 2x / week    PT Duration 4 weeks    PT Treatment/Interventions ADLs/Self Care Home Management;Cryotherapy;Electrical Stimulation;Iontophoresis 61m/ml Dexamethasone;Moist Heat;DME Instruction;Gait training;Stair training;Functional mobility training;Therapeutic activities;Therapeutic exercise;Balance training;Neuromuscular re-education;Patient/family education;Manual techniques;Scar mobilization;Passive range of motion;Dry needling;Taping;Vasopneumatic Device;Joint Manipulations    PT Next Visit Plan Knee FOTO; progress R knee ROM and LE strengthening exercises - progress standing exercises as tolerated & update HEP accordingly; gait training for normal gait pattern with SPC; manual therapy for ROM and edema management; GameReady for pain/edema management    PT Home Exercise Plan Access Code: [email or web address])    Consulted and Agree with Plan of Care Patient;Family member/caregiver    Family Member Consulted husband - Alex             Patient will benefit from skilled therapeutic intervention in order to improve the following deficits and impairments:  Abnormal gait, Decreased activity tolerance, Decreased balance, Decreased endurance, Decreased knowledge of precautions, Decreased knowledge of use of DME, Decreased mobility, Decreased range of motion, Decreased safety awareness,  Decreased scar mobility, Decreased strength, Difficulty walking, Increased edema, Increased fascial restricitons, Increased muscle spasms, Impaired  perceived functional ability, Impaired flexibility, Impaired sensation, Improper body mechanics, Postural dysfunction, Pain  Visit Diagnosis: Stiffness of right knee, not elsewhere classified  Acute pain of right knee  Other abnormalities of gait and mobility  Difficulty in walking, not elsewhere classified  Muscle weakness (generalized)  Localized edema     Problem List Patient Active Problem List   Diagnosis Date Noted   OA (osteoarthritis) of knee 08/11/2020    Percival Spanish, PT, MPT 09/09/2020, 7:21 PM  [facility] [address]  [place], [place], [address] Phone: [phone]   Fax:  [phone]  Name: Kasen Sako MRN: [id number] Date of Birth: 11/08/1945

## 2020-09-15 ENCOUNTER — Other Ambulatory Visit: Payer: Self-pay

## 2020-09-15 ENCOUNTER — Ambulatory Visit: Payer: PPO

## 2020-09-15 DIAGNOSIS — M6281 Muscle weakness (generalized): Secondary | ICD-10-CM

## 2020-09-15 DIAGNOSIS — R262 Difficulty in walking, not elsewhere classified: Secondary | ICD-10-CM

## 2020-09-15 DIAGNOSIS — M25661 Stiffness of right knee, not elsewhere classified: Secondary | ICD-10-CM

## 2020-09-15 DIAGNOSIS — M25561 Pain in right knee: Secondary | ICD-10-CM

## 2020-09-15 DIAGNOSIS — R2689 Other abnormalities of gait and mobility: Secondary | ICD-10-CM

## 2020-09-15 DIAGNOSIS — R600 Localized edema: Secondary | ICD-10-CM

## 2020-09-15 NOTE — Therapy (Signed)
[facility] [address]  [place], [place], [address] Phone: [phone]   Fax:  [phone]  Physical Therapy Treatment  Patient Details  Name: Morgan Hall MRN: [id number] Date of Birth: 1945/11/30 Referring Provider (PT): Gaynelle Arabian, MD   Encounter Date: 09/15/2020   PT End of Session - 09/15/20 1650     Visit Number 11    Number of Visits 18    Date for PT Re-Evaluation 10/07/20    Authorization Type [employer]    PT Start Time 7209    PT Stop Time 1632    PT Time Calculation (min) 54 min    Activity Tolerance Patient tolerated treatment well;Patient limited by pain    Behavior During Therapy [facility] for tasks assessed/performed;Anxious             Past Medical History:  Diagnosis Date   Anxiety    Diabetes mellitus without complication ([place])    Heart murmur    High cholesterol    Hypertension     Past Surgical History:  Procedure Laterality Date   colonscopy     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   Subjective Assessment - 09/15/20 1542     Subjective Pt reporting that she needs more PT and more trouble that she is having with her L LE.    Patient is accompained by: Family member    Pertinent History R TKA 08/11/20    Patient Stated Goals "moving around like I used to before w/o a cane"    Currently in Pain? Yes    Pain Score 2     Pain Location Knee    Pain Orientation Right    Pain Descriptors / Indicators Tightness;Sore    Pain Type Acute pain;Surgical pain                [facility] PT Assessment - 09/15/20 0001       AROM   Right Knee Extension 8    Right Knee Flexion 104                           [facility] Adult PT Treatment/Exercise - 09/15/20 0001       Exercises    Exercises Knee/Hip      Knee/Hip Exercises: Stretches   Active Hamstring Stretch Right;2 reps    Active Hamstring Stretch Limitations seated hip hinge      Knee/Hip Exercises: Aerobic   Recumbent Bike partial rev 172m    Nustep L5 x 6 min (UE/LE)      Knee/Hip Exercises: Standing   Hip Flexion Stengthening;Both;10 reps;Knee bent;3 sets;Knee straight    Hip Flexion Limitations 2#, counter support; knee straight for last set    Terminal Knee Extension Right;Strengthening;10 reps;Theraband    Theraband Level (Terminal Knee Extension) Level 2 (Red)    Lateral Step Up Right;10 reps;Hand Hold: 2;Step Height: 6"    Forward Step Up 10 reps;Hand Hold: 1;Step Height: 6";Left;Right    Functional Squat 10 reps    Functional Squat Limitations cues for post WS and depth to tolerance      Knee/Hip Exercises: Seated   Heel Slides AAROM;Right;10 reps    Heel Slides Limitations foot on peanut ball and strap  Vasopneumatic   Number Minutes Vasopneumatic  10 minutes    Vasopnuematic Location  Knee    Vasopneumatic Pressure Medium    Vasopneumatic Temperature  40                         PT Long Term Goals - 09/09/20 1652       PT LONG TERM GOAL #1   Title Patient will be independent with ongoing/advanced HEP for self-management at home    Status Partially Met   09/09/20 - met for current HEP   Target Date 10/07/20      PT LONG TERM GOAL #2   Title Decrease R knee pain by >/= 50-75% allowing patient increased ease of exercise and mobility    Status On-going   09/09/20 - 40% decrease in pain   Target Date 10/07/20      PT LONG TERM GOAL #3   Title Patient will demonstrate R knee AROM >/= 2-120 dg to allow for normal gait and stair mechanics    Status On-going    Target Date 10/07/20      PT LONG TERM GOAL #4   Title Patient will demonstrate improved R LE strength to >/= 4 to 4+/5 for improved stability and ease of mobility    Status On-going    Target Date 10/07/20       PT LONG TERM GOAL #5   Title Patient to demonstrate symmetrical step length, knee flexion, and good heel-toe pattern with upright posture when ambulating with or w/o SPC or LRAD    Status On-going   09/09/20 - decreased knee extension and lacking heel strike on initial contact with tendency to slide R foot forward with decreased hip and knee flexion during swing phase and decreased R foot clearance unless cued for R foot clearance and heel strike   Target Date 10/07/20                   Plan - 09/15/20 1652     Clinical Impression Statement Pt still feels that she needs to continue with PT to optimize function with her R knee. She is mostly limited by quad/hip flexor weakness and lack of TKE. She has been using the cane coming to therapy sessions but requires instruction for heel to toe pattern. She did report having more pain in her L knee today and stated that she will eventually need a TKA for that as well. She still has mod swelling in her R knee and we talked about the importance of ice along with elevation. R knee AROM remains the same as last visit but her pain levels shows to be decreasing. Cues with TKE to isolate the quads and intermittent cues with most exercises. Ended session with GR for swelling and soreness post session.    Personal Factors and Comorbidities Age;Comorbidity 3+;Fitness;Past/Current Experience;Time since onset of injury/illness/exacerbation    Comorbidities OA, anxiety, DM, HTN, HLD, heart murmur    PT Frequency 2x / week    PT Duration 4 weeks    PT Treatment/Interventions ADLs/Self Care Home Management;Cryotherapy;Electrical Stimulation;Iontophoresis 66m/ml Dexamethasone;Moist Heat;DME Instruction;Gait training;Stair training;Functional mobility training;Therapeutic activities;Therapeutic exercise;Balance training;Neuromuscular re-education;Patient/family education;Manual techniques;Scar mobilization;Passive range of motion;Dry needling;Taping;Vasopneumatic  Device;Joint Manipulations    PT Next Visit Plan Knee FOTO; progress R knee ROM and LE strengthening exercises - progress standing exercises as tolerated & update HEP accordingly; gait training for normal gait pattern with SPC; manual therapy for ROM and edema management; GameReady  for pain/edema management    PT Home Exercise Plan Access Code: [email or web address]/16)    Consulted and Agree with Plan of Care Patient;Family member/caregiver    Family Member Consulted husband - Alex             Patient will benefit from skilled therapeutic intervention in order to improve the following deficits and impairments:  Abnormal gait, Decreased activity tolerance, Decreased balance, Decreased endurance, Decreased knowledge of precautions, Decreased knowledge of use of DME, Decreased mobility, Decreased range of motion, Decreased safety awareness, Decreased scar mobility, Decreased strength, Difficulty walking, Increased edema, Increased fascial restricitons, Increased muscle spasms, Impaired perceived functional ability, Impaired flexibility, Impaired sensation, Improper body mechanics, Postural dysfunction, Pain  Visit Diagnosis: Stiffness of right knee, not elsewhere classified  Acute pain of right knee  Other abnormalities of gait and mobility  Difficulty in walking, not elsewhere classified  Muscle weakness (generalized)  Localized edema     Problem List Patient Active Problem List   Diagnosis Date Noted   OA (osteoarthritis) of knee 08/11/2020    Artist Pais, PTA 09/15/2020, 5:03 PM  [facility] [address]  [place], [place], [address] Phone: [phone]   Fax:  [phone]  Name: Morgan Hall MRN: [id number] Date of Birth: 08-Nov-1945

## 2020-09-16 DIAGNOSIS — Z471 Aftercare following joint replacement surgery: Secondary | ICD-10-CM | POA: Diagnosis not present

## 2020-09-16 DIAGNOSIS — Z96651 Presence of right artificial knee joint: Secondary | ICD-10-CM | POA: Diagnosis not present

## 2020-09-17 ENCOUNTER — Ambulatory Visit: Payer: PPO

## 2020-09-17 ENCOUNTER — Other Ambulatory Visit: Payer: Self-pay

## 2020-09-17 DIAGNOSIS — M25561 Pain in right knee: Secondary | ICD-10-CM

## 2020-09-17 DIAGNOSIS — R262 Difficulty in walking, not elsewhere classified: Secondary | ICD-10-CM

## 2020-09-17 DIAGNOSIS — R600 Localized edema: Secondary | ICD-10-CM

## 2020-09-17 DIAGNOSIS — M6281 Muscle weakness (generalized): Secondary | ICD-10-CM

## 2020-09-17 DIAGNOSIS — R2689 Other abnormalities of gait and mobility: Secondary | ICD-10-CM

## 2020-09-17 DIAGNOSIS — M25661 Stiffness of right knee, not elsewhere classified: Secondary | ICD-10-CM | POA: Diagnosis not present

## 2020-09-17 NOTE — Therapy (Signed)
[facility] [address]  [place], [place], [address] Phone: [phone]   Fax:  [phone]  Physical Therapy Treatment  Patient Details  Name: Lenaya Pietsch MRN: [id number] Date of Birth: 01/17/1946 Referring Provider (PT): Gaynelle Arabian, MD   Encounter Date: 09/17/2020   PT End of Session - 09/17/20 1624     Visit Number 12    Number of Visits 18    Date for PT Re-Evaluation 10/07/20    Authorization Type [employer]    PT Start Time 1532    PT Stop Time 1624    PT Time Calculation (min) 52 min    Activity Tolerance Patient tolerated treatment well;Patient limited by pain    Behavior During Therapy [facility] for tasks assessed/performed;Anxious             Past Medical History:  Diagnosis Date   Anxiety    Diabetes mellitus without complication ([place])    Heart murmur    High cholesterol    Hypertension     Past Surgical History:  Procedure Laterality Date   colonscopy     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   Subjective Assessment - 09/17/20 1538     Subjective Doctor wants to continue with PT and they say I am doing very well.    Patient is accompained by: Family member    Pertinent History R TKA 08/11/20    Patient Stated Goals "moving around like I used to before w/o a cane"    Currently in Pain? No/denies                               [facility] - [facility]20 0001       Ambulation/Gait   Ambulation/Gait Assistance 6: Modified independent (Device/Increase time)    Ambulation Distance (Feet) 220 Feet    Assistive device Straight cane    Gait Pattern Step-through pattern;Decreased step length - right;Decreased weight shift to right;Decreased stance time -  right;Decreased hip/knee flexion - right;Poor foot clearance - right    Gait Comments Pt increased step length initally but stopped after walking for a prolonged period      Knee/Hip Exercises: Aerobic   Recumbent Bike partial rev 6 min      Knee/Hip Exercises: Standing   Hip Flexion Stengthening;Both;10 reps;Knee straight;Knee bent    Hip Flexion Limitations 2#, counter support; 10 reps with knee bent and knee straight    Other Standing Knee Exercises hs curl with 2# weights 10 reps      Knee/Hip Exercises: Seated   Sit to Sand 5 reps;without UE support   with yellow weighted ball     Manual Therapy   Manual Therapy Joint mobilization;Soft tissue mobilization;Edema management;Passive ROM    Joint Mobilization R patellar mobs - multi-directional, focused on knee ext;   Soft tissue mobilization STM to R distal HS, calf, glutes and ITB    Passive ROM knee extension with OP                         PT Long Term Goals - 09/09/20 1652       PT LONG TERM GOAL #1   Title  Patient will be independent with ongoing/advanced HEP for self-management at home    Status Partially Met   09/09/20 - met for current HEP   Target Date 10/07/20      PT LONG TERM GOAL #2   Title Decrease R knee pain by >/= 50-75% allowing patient increased ease of exercise and mobility    Status On-going   09/09/20 - 40% decrease in pain   Target Date 10/07/20      PT LONG TERM GOAL #3   Title Patient will demonstrate R knee AROM >/= 2-120 dg to allow for normal gait and stair mechanics    Status On-going    Target Date 10/07/20      PT LONG TERM GOAL #4   Title Patient will demonstrate improved R LE strength to >/= 4 to 4+/5 for improved stability and ease of mobility    Status On-going    Target Date 10/07/20      PT LONG TERM GOAL #5   Title Patient to demonstrate symmetrical step length, knee flexion, and good heel-toe pattern with upright posture when ambulating with or w/o SPC or LRAD     Status On-going   09/09/20 - decreased knee extension and lacking heel strike on initial contact with tendency to slide R foot forward with decreased hip and knee flexion during swing phase and decreased R foot clearance unless cued for R foot clearance and heel strike   Target Date 10/07/20                   Plan - 09/17/20 1735     Clinical Impression Statement Pt responded well to treatment. She is still ambulating better with her cane but still demonstrates decreased strides. Cues needed with standing ther ex for proper LE positioning and upright posture. We did STM to the R LE and she reported much improvement after this. She continues to need work on [employer] and stretching for the posterior knee. Ended session with vaso for pain and edema.    Personal Factors and Comorbidities Age;Comorbidity 3+;Fitness;Past/Current Experience;Time since onset of injury/illness/exacerbation    Comorbidities OA, anxiety, DM, HTN, HLD, heart murmur    PT Frequency 2x / week    PT Duration 4 weeks    PT Treatment/Interventions ADLs/Self Care Home Management;Cryotherapy;Electrical Stimulation;Iontophoresis 59m/ml Dexamethasone;Moist Heat;DME Instruction;Gait training;Stair training;Functional mobility training;Therapeutic activities;Therapeutic exercise;Balance training;Neuromuscular re-education;Patient/family education;Manual techniques;Scar mobilization;Passive range of motion;Dry needling;Taping;Vasopneumatic Device;Joint Manipulations    PT Next Visit Plan Knee FOTO; progress R knee ROM and LE strengthening exercises - progress standing exercises as tolerated & update HEP accordingly; gait training for normal gait pattern with SPC; manual therapy for ROM and edema management; GameReady for pain/edema management    PT Home Exercise Plan Access Code: [email or web address]([email or web address])    Consulted and Agree with Plan of Care Patient;Family member/caregiver    Family Member Consulted husband - Alex             Patient  will benefit from skilled therapeutic intervention in order to improve the following deficits and impairments:  Abnormal gait, Decreased activity tolerance, Decreased balance, Decreased endurance, Decreased knowledge of precautions, Decreased knowledge of use of DME, Decreased mobility, Decreased range of motion, Decreased safety awareness, Decreased scar mobility, Decreased strength, Difficulty walking, Increased edema, Increased fascial restricitons, Increased muscle spasms, Impaired perceived functional ability, Impaired flexibility, Impaired sensation, Improper body mechanics, Postural dysfunction, Pain  Visit Diagnosis: Stiffness of right knee, not elsewhere classified  Acute pain of right knee  Other  abnormalities of gait and mobility  Difficulty in walking, not elsewhere classified  Muscle weakness (generalized)  Localized edema     Problem List Patient Active Problem List   Diagnosis Date Noted   OA (osteoarthritis) of knee 08/11/2020    Artist Pais, PTA 09/17/2020, 5:52 PM  [facility] [address]  [place], [place], [address] Phone: [phone]   Fax:  [phone]  Name: Laurana Magistro MRN: [id number] Date of Birth: 01-Jul-1945

## 2020-09-23 ENCOUNTER — Other Ambulatory Visit: Payer: Self-pay

## 2020-09-23 ENCOUNTER — Ambulatory Visit: Payer: PPO | Admitting: Physical Therapy

## 2020-09-23 ENCOUNTER — Encounter: Payer: Self-pay | Admitting: Physical Therapy

## 2020-09-23 DIAGNOSIS — M25661 Stiffness of right knee, not elsewhere classified: Secondary | ICD-10-CM | POA: Diagnosis not present

## 2020-09-23 DIAGNOSIS — R262 Difficulty in walking, not elsewhere classified: Secondary | ICD-10-CM

## 2020-09-23 DIAGNOSIS — R600 Localized edema: Secondary | ICD-10-CM

## 2020-09-23 DIAGNOSIS — M25561 Pain in right knee: Secondary | ICD-10-CM

## 2020-09-23 DIAGNOSIS — R2689 Other abnormalities of gait and mobility: Secondary | ICD-10-CM

## 2020-09-23 DIAGNOSIS — M6281 Muscle weakness (generalized): Secondary | ICD-10-CM

## 2020-09-23 NOTE — Therapy (Signed)
[facility] [address]  [place], [place], [address] Phone: [phone]   Fax:  [phone]  Physical Therapy Treatment  Patient Details  Name: Morgan Hall MRN: [id number] Date of Birth: Jan 19, 1946 Referring Provider (PT): Gaynelle Arabian, MD   Encounter Date: 09/23/2020   PT End of Session - 09/23/20 1532     Visit Number 13    Number of Visits 18    Date for PT Re-Evaluation 10/07/20    Authorization Type [employer]    PT Start Time 1532    PT Stop Time 1614    PT Time Calculation (min) 42 min    Activity Tolerance Patient tolerated treatment well    Behavior During Therapy [facility] for tasks assessed/performed;Anxious             Past Medical History:  Diagnosis Date   Anxiety    Diabetes mellitus without complication ([place])    Heart murmur    High cholesterol    Hypertension     Past Surgical History:  Procedure Laterality Date   colonscopy     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   Subjective Assessment - 09/23/20 1539     Patient is accompained by: Family member    Pertinent History R TKA 08/11/20    Patient Stated Goals "moving around like I used to before w/o a cane"    Currently in Pain? Yes    Pain Score 0-No pain    Pain Location Knee    Pain Orientation Right    Pain Score 4    Pain Orientation Left    Pain Type Chronic pain    Pain Frequency Occasional    Pain Score 7    Pain Location Back    Pain Orientation Lower    Pain Descriptors / Indicators Aching    Pain Type Acute pain    Pain Onset In the past 7 days                [facility] - 09/23/20 1532       Assessment   Medical Diagnosis R TKA    Referring Provider (PT) FGaynelle Arabian MD    Onset Date/Surgical Date  08/11/20    Next MD Visit 10/21/20      AROM   Right Knee Extension 7   in LAQ; 5 when supported   Right Knee Flexion 108                           [facility] Adult PT Treatment/Exercise - 09/23/20 1532       Exercises   Exercises Knee/Hip      Lumbar Exercises: Stretches   Lower Trunk Rotation Limitations 10 x 5"      Knee/Hip Exercises: Stretches   Piriformis Stretch Right;2 reps;30 seconds    Piriformis Stretch Limitations supine KTOS with pillowcase for extended reach      Knee/Hip Exercises: Aerobic   Recumbent Bike partial revolutions/rocking for ROM x 6 min      Knee/Hip Exercises: Standing   Hip Flexion Right;Left;10 reps;Stengthening;Knee straight    Hip Flexion Limitations red TB; UE support on back of chair    Terminal Knee Extension Right;20 reps;Strengthening;Theraband    Theraband  Level (Terminal Knee Extension) Level 2 (Red)    Hip ADduction Right;Left;10 reps;Strengthening    Hip ADduction Limitations red TB; UE support on back of chair    Hip Abduction Right;Left;10 reps;Stengthening;Knee straight    Abduction Limitations red TB; UE support on back of chair    Hip Extension Right;Left;10 reps;Stengthening;Knee straight    Extension Limitations red TB; UE support on back of chair      Knee/Hip Exercises: Supine   Bridges Both;10 reps;Strengthening      Manual Therapy   Manual Therapy Soft tissue mobilization;Myofascial release    Soft tissue mobilization STM to R glutes & piriformis    Myofascial Release manual TPR to R glutes & piriformis                         PT Long Term Goals - 09/09/20 1652       PT LONG TERM GOAL #1   Title Patient will be independent with ongoing/advanced HEP for self-management at home    Status Partially Met   09/09/20 - met for current HEP   Target Date 10/07/20      PT LONG TERM GOAL #2   Title Decrease R knee pain by >/= 50-75% allowing patient increased ease of exercise and mobility     Status On-going   09/09/20 - 40% decrease in pain   Target Date 10/07/20      PT LONG TERM GOAL #3   Title Patient will demonstrate R knee AROM >/= 2-120 dg to allow for normal gait and stair mechanics    Status On-going    Target Date 10/07/20      PT LONG TERM GOAL #4   Title Patient will demonstrate improved R LE strength to >/= 4 to 4+/5 for improved stability and ease of mobility    Status On-going    Target Date 10/07/20      PT LONG TERM GOAL #5   Title Patient to demonstrate symmetrical step length, knee flexion, and good heel-toe pattern with upright posture when ambulating with or w/o SPC or LRAD    Status On-going   09/09/20 - decreased knee extension and lacking heel strike on initial contact with tendency to slide R foot forward with decreased hip and knee flexion during swing phase and decreased R foot clearance unless cued for R foot clearance and heel strike   Target Date 10/07/20                   Plan - 09/23/20 1614     Clinical Impression Statement Morgan Hall is more consistently ambulating with her SPC but continues to demonstrate decreased stride length, decreased hip and knee flexion and decreased heel strike, therefore continued gait training to normalize gait pattern.  She also notes increased LBP recently with increased muscle tension and TTP evident in R lateral glutes and piriformis which was addressed with manual STM/TPR followed by stretching and strengthening to normalize muscle activation with some relief noted. Progressed standing exercises adding TB resistance targeting proximal stability with good tolerance other than some fatigue noted. R knee ROM continues to improve with AROM now 7-108 and extension down to 5 with leg supported.    Comorbidities OA, anxiety, DM, HTN, HLD, heart murmur    Rehab Potential Good    PT Frequency 2x / week    PT Duration 4 weeks    PT Treatment/Interventions ADLs/Self Care Home Management;Cryotherapy;Electrical  Stimulation;Iontophoresis 24m/ml Dexamethasone;Moist Heat;DME Instruction;Gait  training;Stair training;Functional mobility training;Therapeutic activities;Therapeutic exercise;Balance training;Neuromuscular re-education;Patient/family education;Manual techniques;Scar mobilization;Passive range of motion;Dry needling;Taping;Vasopneumatic Device;Joint Manipulations    PT Next Visit Plan progress R knee ROM and LE strengthening exercises - progress standing exercises as tolerated & update HEP accordingly; gait training for normal gait pattern with SPC; manual therapy for ROM and edema management; GameReady for pain/edema management PRN    PT Home Exercise Plan Access Code: [email or web address] (6/16)    Consulted and Agree with Plan of Care Patient;Family member/caregiver    Family Member Consulted husband - Alex             Patient will benefit from skilled therapeutic intervention in order to improve the following deficits and impairments:  Abnormal gait, Decreased activity tolerance, Decreased balance, Decreased endurance, Decreased knowledge of precautions, Decreased knowledge of use of DME, Decreased mobility, Decreased range of motion, Decreased safety awareness, Decreased scar mobility, Decreased strength, Difficulty walking, Increased edema, Increased fascial restricitons, Increased muscle spasms, Impaired perceived functional ability, Impaired flexibility, Impaired sensation, Improper body mechanics, Postural dysfunction, Pain  Visit Diagnosis: Stiffness of right knee, not elsewhere classified  Acute pain of right knee  Other abnormalities of gait and mobility  Difficulty in walking, not elsewhere classified  Muscle weakness (generalized)  Localized edema     Problem List Patient Active Problem List   Diagnosis Date Noted   OA (osteoarthritis) of knee 08/11/2020    Percival Spanish, PT, MPT 09/23/2020, 8:11 PM  [facility] [address]  [place], [place], [address] Phone: [phone]   Fax:  [phone]  Name: Morgan Hall MRN: [id number] Date of Birth: Sep 10, 1945

## 2020-09-25 ENCOUNTER — Ambulatory Visit: Payer: PPO | Admitting: Physical Therapy

## 2020-09-25 ENCOUNTER — Other Ambulatory Visit: Payer: Self-pay

## 2020-09-25 ENCOUNTER — Encounter: Payer: Self-pay | Admitting: Physical Therapy

## 2020-09-25 DIAGNOSIS — R600 Localized edema: Secondary | ICD-10-CM

## 2020-09-25 DIAGNOSIS — M25661 Stiffness of right knee, not elsewhere classified: Secondary | ICD-10-CM

## 2020-09-25 DIAGNOSIS — M25561 Pain in right knee: Secondary | ICD-10-CM

## 2020-09-25 DIAGNOSIS — R262 Difficulty in walking, not elsewhere classified: Secondary | ICD-10-CM

## 2020-09-25 DIAGNOSIS — R2689 Other abnormalities of gait and mobility: Secondary | ICD-10-CM

## 2020-09-25 DIAGNOSIS — M6281 Muscle weakness (generalized): Secondary | ICD-10-CM

## 2020-09-25 NOTE — Therapy (Signed)
[facility] [address]  [place], [place], [address] Phone: [phone]   Fax:  [phone]  Physical Therapy Treatment  Patient Details  Name: Morgan Hall MRN: [id number] Date of Birth: Jun 28, 1945 Referring Provider (PT): Gaynelle Arabian, MD   Encounter Date: 09/25/2020   PT End of Session - 09/25/20 1442     Visit Number 14    Number of Visits 18    Date for PT Re-Evaluation 10/07/20    Authorization Type [employer]    PT Start Time 1442    PT Stop Time 1543    PT Time Calculation (min) 61 min    Activity Tolerance Patient tolerated treatment well    Behavior During Therapy [facility] for tasks assessed/performed;Anxious             Past Medical History:  Diagnosis Date   Anxiety    Diabetes mellitus without complication ([place])    Heart murmur    High cholesterol    Hypertension     Past Surgical History:  Procedure Laterality Date   colonscopy     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   Subjective Assessment - 09/25/20 1444     Subjective Pt reports she tried to do a deep clean on her house today and now is very stiff but no pain. Did note some pain in her R distal LE last night. MT last visit helped with her buttock soreness.    Patient is accompained by: Family member    Pertinent History R TKA 08/11/20    Patient Stated Goals "moving around like I used to before w/o a cane"    Currently in Pain? No/denies    Pain Onset In the past 7 days                               [facility]/Exercise - 09/25/20 1442       Exercises   Exercises Knee/Hip      Knee/Hip Exercises: Aerobic   Nustep L5 x 6 min (UE/LE)      Knee/Hip Exercises: Machines for Strengthening   Cybex Knee  Extension B LE 5# 2 x 10    Cybex Knee Flexion B LE 10# x 10; B con/R ecc 5# x 10    Cybex Leg Press --   attempted but unable to place and maintain feet on leg press plate     Knee/Hip Exercises: Seated   Long Arc Quad Right;2 sets;10 reps;Strengthening;Weights    Long Arc Quad Weight 2 lbs.    Long [employer]Limitations + hip ADD ball squeeze    Other Seated Knee/Hip Exercises R FItter leg press (1 black/1 blue) 2 x 10 reps    Hamstring Curl Right;10 reps;2 sets;Strengthening    Hamstring Limitations red TB      Vasopneumatic   Number Minutes Vasopneumatic  10 minutes   seated with R LE elevated on 9" stool + 2 Airex pads   Vasopnuematic Location  Knee   Rt   Vasopneumatic Pressure Medium    Vasopneumatic Temperature  34      Manual Therapy   Manual Therapy Soft tissue mobilization;Myofascial release    Soft tissue mobilization STM to  R glutes & piriformis    Myofascial Release manual TPR to R glutes & piriformis    Other Manual Therapy Provided instruction in self-STM to glutes, piriformis, quads, HS and gastroc using ball on wall or rolling pin.                         PT Long Term Goals - 09/25/20 1447       PT LONG TERM GOAL #1   Title Patient will be independent with ongoing/advanced HEP for self-management at home    Status Partially Met   09/09/20 - met for current HEP   Target Date 10/07/20      PT LONG TERM GOAL #2   Title Decrease R knee pain by >/= 50-75% allowing patient increased ease of exercise and mobility    Status Achieved   09/25/20 - 50% decrease in pain     PT LONG TERM GOAL #3   Title Patient will demonstrate R knee AROM >/= 2-120 dg to allow for normal gait and stair mechanics    Status On-going    Target Date 10/07/20      PT LONG TERM GOAL #4   Title Patient will demonstrate improved R LE strength to >/= 4 to 4+/5 for improved stability and ease of mobility    Status On-going      PT LONG TERM GOAL #5   Title Patient to  demonstrate symmetrical step length, knee flexion, and good heel-toe pattern with upright posture when ambulating with or w/o SPC or LRAD    Status On-going   09/09/20 - decreased knee extension and lacking heel strike on initial contact with tendency to slide R foot forward with decreased hip and knee flexion during swing phase and decreased R foot clearance unless cued for R foot clearance and heel strike   Target Date 10/07/20                   Plan - 09/25/20 1449     Clinical Impression Statement Morgan Hall reports she overdid it while trying to clean her house today and now is stiff and sore, mostly in her L LE more than her R. She notes on days such as today she has to rely more heavily on her cane while ambulating and demonstrates shorter stride length with decreased hip and knee flexion during swing through. Exercises completed primarily in sitting today per pt request due to fatigue and soreness from her prior activity. Further MT including STM/MFR and manual TPR performed to address ongoing increased muscle tension and TTP in R lateral glutes and piriformis and provided education in self-STM techniques using a rolling pin or a ball on the wall for home. Session concluded with GR vasopnuematic compression to address increased pain and edema.    Comorbidities OA, anxiety, DM, HTN, HLD, heart murmur    Rehab Potential Good    PT Frequency 2x / week    PT Duration 4 weeks    PT Treatment/Interventions ADLs/Self Care Home Management;Cryotherapy;Electrical Stimulation;Iontophoresis 29m/ml Dexamethasone;Moist Heat;DME Instruction;Gait training;Stair training;Functional mobility training;Therapeutic activities;Therapeutic exercise;Balance training;Neuromuscular re-education;Patient/family education;Manual techniques;Scar mobilization;Passive range of motion;Dry needling;Taping;Vasopneumatic Device;Joint Manipulations    PT Next Visit Plan progress R knee ROM and LE strengthening exercises -  progress standing exercises as tolerated & update HEP accordingly; gait training for normal gait pattern with SPC; manual therapy for ROM and edema management; GameReady for pain/edema management PRN    PT Home Exercise Plan Access Code:  [email or web address] (6/16)    Consulted and Agree with Plan of Care Patient;Family member/caregiver    Family Member Consulted husband - Morgan Hall             Patient will benefit from skilled therapeutic intervention in order to improve the following deficits and impairments:  Abnormal gait, Decreased activity tolerance, Decreased balance, Decreased endurance, Decreased knowledge of precautions, Decreased knowledge of use of DME, Decreased mobility, Decreased range of motion, Decreased safety awareness, Decreased scar mobility, Decreased strength, Difficulty walking, Increased edema, Increased fascial restricitons, Increased muscle spasms, Impaired perceived functional ability, Impaired flexibility, Impaired sensation, Improper body mechanics, Postural dysfunction, Pain  Visit Diagnosis: Stiffness of right knee, not elsewhere classified  Acute pain of right knee  Other abnormalities of gait and mobility  Difficulty in walking, not elsewhere classified  Muscle weakness (generalized)  Localized edema     Problem List Patient Active Problem List   Diagnosis Date Noted   OA (osteoarthritis) of knee 08/11/2020    Percival Spanish, PT, MPT 09/25/2020, 7:37 PM  [facility] [address]  [place], [place], [address] Phone: [phone]   Fax:  [phone]  Name: Morgan Hall MRN: [id number] Date of Birth: 12/26/1945

## 2020-09-30 ENCOUNTER — Encounter: Payer: Self-pay | Admitting: Physical Therapy

## 2020-09-30 ENCOUNTER — Ambulatory Visit: Payer: PPO | Attending: Orthopedic Surgery | Admitting: Physical Therapy

## 2020-09-30 ENCOUNTER — Other Ambulatory Visit: Payer: Self-pay

## 2020-09-30 DIAGNOSIS — M25661 Stiffness of right knee, not elsewhere classified: Secondary | ICD-10-CM

## 2020-09-30 DIAGNOSIS — R600 Localized edema: Secondary | ICD-10-CM

## 2020-09-30 DIAGNOSIS — R262 Difficulty in walking, not elsewhere classified: Secondary | ICD-10-CM

## 2020-09-30 DIAGNOSIS — R2689 Other abnormalities of gait and mobility: Secondary | ICD-10-CM

## 2020-09-30 DIAGNOSIS — M25561 Pain in right knee: Secondary | ICD-10-CM | POA: Diagnosis not present

## 2020-09-30 DIAGNOSIS — M6281 Muscle weakness (generalized): Secondary | ICD-10-CM | POA: Diagnosis not present

## 2020-09-30 NOTE — Therapy (Signed)
[facility] [address]  [place], [place], [address] Phone: [phone]   Fax:  [phone]  Physical Therapy Treatment  Patient Details  Name: Morgan Hall MRN: [id number] Date of Birth: [birth year] Referring Provider (PT): Gaynelle Arabian, MD   Encounter Date: 09/30/2020   PT End of Session - 09/30/20 1358     Visit Number 15    Number of Visits 18    Date for PT Re-Evaluation 10/07/20    Authorization Type [employer]    PT Start Time 3419    PT Stop Time 1457    PT Time Calculation (min) 59 min    Activity Tolerance Patient tolerated treatment well    Behavior During Therapy [facility] for tasks assessed/performed;Anxious             Past Medical History:  Diagnosis Date   Anxiety    Diabetes mellitus without complication ([place])    Heart murmur    High cholesterol    Hypertension     Past Surgical History:  Procedure Laterality Date   colonscopy     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   Subjective Assessment - 09/30/20 1402     Subjective Pt reports no pain in her R knee but states her incision is still sensitive. She notes her L knee has been bothering her more lately - was bothering her last night and again this morning - better since she put Voltaren cream on it.    Patient is accompained by: Family member    Pertinent History R TKA 08/11/20    Patient Stated Goals "moving around like I used to before w/o a cane"    Currently in Pain? No/denies    Pain Onset In the past 7 days                [facility] Assessment - 09/30/20 1358       AROM   Right Knee Extension 6   in LAQ; 3 when supported after manual therapy   Right Knee Flexion 110      Ambulation/Gait   Ambulation/Gait Assistance 5:  Supervision    Assistive device Straight cane;None    Gait Comments Pt having difficulty coordinating cane on R for L LE, therefore attempted gait w/o AD - better fluidity of gait but cues necessary to increase foot clearance and improve heel strike to avoid shuffling her feet.                           OPyattAdult PT Treatment/Exercise - 09/30/20 1358       Ambulation/Gait   Ambulation/Gait Assistance Details Cues for proper sequencing with SPC on R to offload her now more painful L knee      Exercises   Exercises Knee/Hip      Knee/Hip Exercises: Stretches   Gastroc Stretch Right;3 reps;30 seconds    Gastroc Stretch Limitations runner's stretch UE support on counter      Knee/Hip Exercises: Aerobic   Nustep L5 x 6 min (UE/LE)      Knee/Hip Exercises: Standing   Heel Raises Both;20 reps;3 seconds    Hip Flexion Right;Left;10 reps;Stengthening;Knee bent    Hip Flexion Limitations march with looped red TB at  midfoot    Functional Squat 15 reps;3 seconds    Functional Squat Limitations counter squat - cues for post WS and depth to tolerance    Other Standing Knee Exercises Red TB B side step & fwd monster walk 2 x 10 ft along counter      Knee/Hip Exercises: Seated   Other Seated Knee/Hip Exercises R leg extension stretch with active quad sets & leg lengthening 10 x 5" with foot elevated on rolling stool in longsitting      Vasopneumatic   Number Minutes Vasopneumatic  10 minutes   seated with R LE elevated on 9" stool + 2 Airex pads   Vasopnuematic Location  Knee   Rt   Vasopneumatic Pressure Medium    Vasopneumatic Temperature  34      Manual Therapy   Manual Therapy Joint mobilization;Soft tissue mobilization;Passive ROM;Manual Traction    Joint Mobilization grade III-IV R femur on tibia APs with manual femur IR in standing runner's stretch    Soft tissue mobilization R knee incisional scar massage & desensitization    Passive ROM gentle overpressure into  R knee extension with foot elevated on rolling stool in longsitting    Manual Traction R LE longitudinal distraction with slight tibial ER while pt performing active quad sets                         PT Long Term Goals - 09/25/20 1447       PT LONG TERM GOAL #1   Title Patient will be independent with ongoing/advanced HEP for self-management at home    Status Partially Met   09/09/20 - met for current HEP   Target Date 10/07/20      PT LONG TERM GOAL #2   Title Decrease R knee pain by >/= 50-75% allowing patient increased ease of exercise and mobility    Status Achieved   09/25/20 - 50% decrease in pain     PT LONG TERM GOAL #3   Title Patient will demonstrate R knee AROM >/= 2-120 dg to allow for normal gait and stair mechanics    Status On-going    Target Date 10/07/20      PT LONG TERM GOAL #4   Title Patient will demonstrate improved R LE strength to >/= 4 to 4+/5 for improved stability and ease of mobility    Status On-going      PT LONG TERM GOAL #5   Title Patient to demonstrate symmetrical step length, knee flexion, and good heel-toe pattern with upright posture when ambulating with or w/o SPC or LRAD    Status On-going   09/09/20 - decreased knee extension and lacking heel strike on initial contact with tendency to slide R foot forward with decreased hip and knee flexion during swing phase and decreased R foot clearance unless cued for R foot clearance and heel strike   Target Date 10/07/20                   Plan - 09/30/20 1411     Clinical Impression Statement Morgan Hall noting increased pain in her L (non-operative) knee recently and requested to work on both knees during session today, therefore majority of strengthening exercise targeting bilateral LEs. Continued emphasis on R knee extension ROM with supported extension improved to 3 following MT. Gait training attempted with cane in R hand to offset now more painful L knee however pt having  difficulty coordinating cane on  R for L LE. Attempted gait w/o AD - better fluidity of gait but cues necessary to increase foot clearance and improve heel strike to avoid shuffling her feet - encouraged pt to practice in her home w/o cane focusing on full step length and increased foot clearance. Morgan Hall will continue to benefit from skilled PT to maximize functional R knee ROM and strength as well as normalize gait pattern.    Comorbidities OA, anxiety, DM, HTN, HLD, heart murmur    Rehab Potential Good    PT Frequency 2x / week    PT Duration 4 weeks    PT Treatment/Interventions ADLs/Self Care Home Management;Cryotherapy;Electrical Stimulation;Iontophoresis 93m/ml Dexamethasone;Moist Heat;DME Instruction;Gait training;Stair training;Functional mobility training;Therapeutic activities;Therapeutic exercise;Balance training;Neuromuscular re-education;Patient/family education;Manual techniques;Scar mobilization;Passive range of motion;Dry needling;Taping;Vasopneumatic Device;Joint Manipulations    PT Next Visit Plan progress R knee ROM and LE strengthening exercises - progress standing exercises as tolerated & update HEP accordingly; gait training for normal gait pattern with SPC; manual therapy for ROM and edema management; GameReady for pain/edema management PRN    PT Home Exercise Plan Access Code: [email or web address])    Consulted and Agree with Plan of Care Patient;Family member/caregiver    Family Member Consulted husband - Alex             Patient will benefit from skilled therapeutic intervention in order to improve the following deficits and impairments:  Abnormal gait, Decreased activity tolerance, Decreased balance, Decreased endurance, Decreased knowledge of precautions, Decreased knowledge of use of DME, Decreased mobility, Decreased range of motion, Decreased safety awareness, Decreased scar mobility, Decreased strength, Difficulty walking, Increased edema, Increased fascial restricitons,  Increased muscle spasms, Impaired perceived functional ability, Impaired flexibility, Impaired sensation, Improper body mechanics, Postural dysfunction, Pain  Visit Diagnosis: Stiffness of right knee, not elsewhere classified  Acute pain of right knee  Other abnormalities of gait and mobility  Difficulty in walking, not elsewhere classified  Muscle weakness (generalized)  Localized edema     Problem List Patient Active Problem List   Diagnosis Date Noted   OA (osteoarthritis) of knee 08/11/2020    JPercival Spanish PT, MPT 09/30/2020, 7:59 PM  [facility] [address] [place] [address]Phone: [phone]  Fax:  [phone] Name: Morgan BarloweMRN: [id number] of Birth: 127-Apr-1947

## 2020-10-02 ENCOUNTER — Encounter: Payer: Self-pay | Admitting: Physical Therapy

## 2020-10-02 ENCOUNTER — Other Ambulatory Visit: Payer: Self-pay

## 2020-10-02 ENCOUNTER — Ambulatory Visit: Payer: PPO | Admitting: Physical Therapy

## 2020-10-02 DIAGNOSIS — R262 Difficulty in walking, not elsewhere classified: Secondary | ICD-10-CM

## 2020-10-02 DIAGNOSIS — M6281 Muscle weakness (generalized): Secondary | ICD-10-CM

## 2020-10-02 DIAGNOSIS — R600 Localized edema: Secondary | ICD-10-CM

## 2020-10-02 DIAGNOSIS — R2689 Other abnormalities of gait and mobility: Secondary | ICD-10-CM

## 2020-10-02 DIAGNOSIS — M25661 Stiffness of right knee, not elsewhere classified: Secondary | ICD-10-CM

## 2020-10-02 DIAGNOSIS — M25561 Pain in right knee: Secondary | ICD-10-CM

## 2020-10-02 NOTE — Therapy (Signed)
[facility] [address]  [place], [place], [address] Phone: [phone]   Fax:  [phone]  Physical Therapy Treatment  Patient Details  Name: Morgan Hall MRN: [id number] Date of Birth: 1945-12-20 Referring Provider (PT): Gaynelle Arabian, MD   Encounter Date: 10/02/2020   PT End of Session - 10/02/20 1401     Visit Number 16    Number of Visits 18    Date for PT Re-Evaluation 10/07/20    Authorization Type [employer]    PT Start Time 1401    PT Stop Time 1451    PT Time Calculation (min) 50 min    Activity Tolerance Patient tolerated treatment well    Behavior During Therapy [facility] for tasks assessed/performed             Past Medical History:  Diagnosis Date   Anxiety    Diabetes mellitus without complication ([place])    Heart murmur    High cholesterol    Hypertension     Past Surgical History:  Procedure Laterality Date   colonscopy     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                      OOscodaAdult PT Treatment/Exercise - 10/02/20 1401       Ambulation/Gait   Ambulation/Gait Assistance 5: Supervision    Assistive device Straight cane    Stairs Yes    Stairs Assistance 5: Supervision;4: Min guard    Stairs Assistance Details (indicate cue type and reason) cues initially to lead with R leg on ascent and L leg on descent; 2nd set with reciprocal pattern    Stair Management Technique One rail Right;Step to pattern;Alternating pattern;Forwards    Number of Stairs 14   x 2   Height of Stairs 7    Gait Comments Pt demonstrating better sequencing with cane today. Difficultly with reciprocal stair ascent & descent but only mild lack of control with R eccentric lowering.      Exercises    Exercises Knee/Hip      Knee/Hip Exercises: Standing   Heel Raises Both;20 reps;3 seconds    Terminal Knee Extension Right;2 sets;10 reps;Strengthening;Theraband    Theraband Level (Terminal Knee Extension) Level 4 (Blue)    Terminal Knee Extension Limitations cues for quad activation, avoiding posterior hip thrust    Lateral Step Up Right;10 reps;Step Height: 6";Hand Hold: 2    Forward Step Up Right;10 reps;2 sets;Step Height: 6";Hand Hold: 1    Forward Step Up Limitations cues for TKE on lift; 2nd set with blue TB TKE    Step Down Right;10 reps;Step Height: 4";Hand Hold: 2    Step Down Limitations eccentric lowering with L heel tap to floor    Functional Squat 15 reps;3 seconds    Functional Squat Limitations counter squat - cues for post WS and depth to tolerance      Knee/Hip Exercises: Seated   Long Arc Quad Right;2 sets;10 reps;Strengthening;Weights    Long Arc Quad Weight 3 lbs.    Long Arc Quad Limitations + hip ADD ball squeeze    Clamshell with TheraBand Red   20 x 3"   Marching Both;20 reps;Strengthening    Marching Limitations red TB march  Vasopneumatic   Number Minutes Vasopneumatic  10 minutes   seated with R LE elevated on 9" stool + 2 Airex pads   Vasopnuematic Location  Knee   Rt   Vasopneumatic Pressure Medium    Vasopneumatic Temperature  34                         PT Long Term Goals - 09/25/20 1447       PT LONG TERM GOAL #1   Title Patient will be independent with ongoing/advanced HEP for self-management at home    Status Partially Met   09/09/20 - met for current HEP   Target Date 10/07/20      PT LONG TERM GOAL #2   Title Decrease R knee pain by >/= 50-75% allowing patient increased ease of exercise and mobility    Status Achieved   09/25/20 - 50% decrease in pain     PT LONG TERM GOAL #3   Title Patient will demonstrate R knee AROM >/= 2-120 dg to allow for normal gait and stair mechanics    Status On-going    Target Date  10/07/20      PT LONG TERM GOAL #4   Title Patient will demonstrate improved R LE strength to >/= 4 to 4+/5 for improved stability and ease of mobility    Status On-going      PT LONG TERM GOAL #5   Title Patient to demonstrate symmetrical step length, knee flexion, and good heel-toe pattern with upright posture when ambulating with or w/o SPC or LRAD    Status On-going   09/09/20 - decreased knee extension and lacking heel strike on initial contact with tendency to slide R foot forward with decreased hip and knee flexion during swing phase and decreased R foot clearance unless cued for R foot clearance and heel strike   Target Date 10/07/20                   Plan - 10/02/20 1443     Clinical Impression Statement Morgan Hall has improved with her sequencing with the [facility] but continues to require repeated cues for foot clearance with gait due to persistent shuffling gait. She struggled with coordinating reciprocal stair negotiation but was able to lead stair ascent with R LE and descent with L foot (lowering with R) with only mild limitation in R eccentric quad control with step-to pattern. Strengthening targeting quad control and functional activities with step-up and downs. Will plan for formal goal assessment next visit to determine readiness for transition to HEP vs need for recert.    Comorbidities OA, anxiety, DM, HTN, HLD, heart murmur    Rehab Potential Good    PT Frequency 2x / week    PT Duration 4 weeks    PT Treatment/Interventions ADLs/Self Care Home Management;Cryotherapy;Electrical Stimulation;Iontophoresis 34m/ml Dexamethasone;Moist Heat;DME Instruction;Gait training;Stair training;Functional mobility training;Therapeutic activities;Therapeutic exercise;Balance training;Neuromuscular re-education;Patient/family education;Manual techniques;Scar mobilization;Passive range of motion;Dry needling;Taping;Vasopneumatic Device;Joint Manipulations    PT Next Visit Plan Goal assessment  & FOTO to determine readiness for transition to HEP (review & update as indicated) vs recert for continued progression of  R knee ROM and LE strengthening exercises - progress standing exercises as tolerated & update HEP accordingly; gait training for normal gait pattern with SPC & stair negotiation; manual therapy for ROM and edema management; GameReady for pain/edema management PRN    PT Home Exercise Plan Access Code: [email or web address])    Consulted and Agree  with Plan of Care Patient;Family member/caregiver    Family Member Consulted husband - Alex             Patient will benefit from skilled therapeutic intervention in order to improve the following deficits and impairments:  Abnormal gait, Decreased activity tolerance, Decreased balance, Decreased endurance, Decreased knowledge of precautions, Decreased knowledge of use of DME, Decreased mobility, Decreased range of motion, Decreased safety awareness, Decreased scar mobility, Decreased strength, Difficulty walking, Increased edema, Increased fascial restricitons, Increased muscle spasms, Impaired perceived functional ability, Impaired flexibility, Impaired sensation, Improper body mechanics, Postural dysfunction, Pain  Visit Diagnosis: Stiffness of right knee, not elsewhere classified  Acute pain of right knee  Other abnormalities of gait and mobility  Difficulty in walking, not elsewhere classified  Muscle weakness (generalized)  Localized edema     Problem List Patient Active Problem List   Diagnosis Date Noted   OA (osteoarthritis) of knee 08/11/2020    Percival Spanish, PT, MPT 10/02/2020, 7:08 PM  [facility] [address]  [place], [place], [address] Phone: [phone]   Fax:  [phone]  Name: Morgan Hall MRN: [id number] Date of Birth: [birth year]

## 2020-10-07 ENCOUNTER — Other Ambulatory Visit: Payer: Self-pay

## 2020-10-07 ENCOUNTER — Encounter: Payer: Self-pay | Admitting: Physical Therapy

## 2020-10-07 ENCOUNTER — Ambulatory Visit: Payer: PPO | Admitting: Physical Therapy

## 2020-10-07 DIAGNOSIS — M6281 Muscle weakness (generalized): Secondary | ICD-10-CM

## 2020-10-07 DIAGNOSIS — M25661 Stiffness of right knee, not elsewhere classified: Secondary | ICD-10-CM

## 2020-10-07 DIAGNOSIS — R600 Localized edema: Secondary | ICD-10-CM

## 2020-10-07 DIAGNOSIS — R262 Difficulty in walking, not elsewhere classified: Secondary | ICD-10-CM

## 2020-10-07 DIAGNOSIS — M25561 Pain in right knee: Secondary | ICD-10-CM

## 2020-10-07 DIAGNOSIS — R2689 Other abnormalities of gait and mobility: Secondary | ICD-10-CM

## 2020-10-07 NOTE — Patient Instructions (Signed)
       Access Code: [email or web address] URL: [email or web address] Date: 10/07/2020 Prepared by: Glenetta Hew  Exercises Seated Heel Slide - 3 x daily - 7 x weekly - 2 sets - 10 reps - 3 sec hold Seated Knee Extension with Resistance - 1 x daily - 7 x weekly - 2 sets - 10 reps - 3 sec hold Seated Passive Knee Extension - 3 x daily - 7 x weekly - 3 reps - 30-60 sec hold Quad Setting and Stretching - 3 x daily - 7 x weekly - 2 sets - 10 reps - 3-5 sec hold Hip Abduction with Resistance Loop - 1 x daily - 7 x weekly - 2 sets - 10 reps - 3 sec hold Hip Extension with Resistance Loop - 1 x daily - 7 x weekly - 2 sets - 10 reps - 3 sec hold Marching with Resistance - 1 x daily - 7 x weekly - 2 sets - 10 reps - 3 sec hold Standing Terminal Knee Extension with Resistance - 1 x daily - 7 x weekly - 2 sets - 10 reps - 5 sec hold

## 2020-10-07 NOTE — Therapy (Addendum)
PHYSICAL THERAPY DISCHARGE SUMMARY (12/09/2020)  Visits from Start of Care: 16  Current functional level related to goals / functional outcomes: See progress note below.   All goals met or nearly met.    Remaining deficits: See progress note below. 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 She can navigate stairs reciprocally with UE support on railing but does not feel as confident and struggles with L eccentric lowering due to L knee pain. Her R knee AROM is now 5-117 degrees with extension down to 3 when LE supported after manual therapy. Her overall LE strength is now >/= 4/5 with greatest weakness still proximally at hips"   Education / Equipment: HEP  Plan: Patient agrees to discharge.   Patient is being discharged due to meeting the stated rehab goals.   She had been placed on 30 day hold on 10/07/2020 and did not return within that time frame.      Rennie Natter, PT, DPT   [facility] [address]  [place], [place], [address] Phone: [phone]   Fax:  [phone]  Physical Therapy Treatment / Progress Note  Patient Details  Name: Morgan Hall MRN: [id number] Date of Birth: [birth year] Referring Provider (PT): Gaynelle Arabian, MD  Progress Note  Reporting Period 09/09/2020 to 10/07/2020  See note below for Objective Data and Assessment of Progress/Goals.     Encounter Date: 10/07/2020   PT End of Session - 10/07/20 1313     Visit Number 16    Number of Visits 18    Date for PT Re-Evaluation 10/07/20    Authorization Type [employer]    PT Start Time 1313    PT Stop Time 1412    PT Time Calculation (min) 59 min    Activity Tolerance Patient tolerated treatment well    Behavior During Therapy WFL for tasks assessed/performed             Past Medical History:  Diagnosis Date    Anxiety    Diabetes mellitus without complication ([place])    Heart murmur    High cholesterol    Hypertension     Past Surgical History:  Procedure Laterality Date   colonscopy     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   Subjective Assessment - 10/07/20 1316     Subjective Pt reports no pain in her R knee but still painful on L from working more while R knee has been recovering form surgery.    Patient is accompained by: Family member    Pertinent History R TKA 08/11/20    Patient Stated Goals "moving around like I used to before w/o a cane"    Currently in Pain? Yes    Pain Score 0-No pain    Pain Location Knee    Pain Orientation Right    Pain Score 2    Pain Location Knee    Pain Orientation Left    Pain Descriptors / Indicators Sharp    Pain Type Chronic pain    Pain Onset In the past 7 days                [facility]  [facility]essment - 10/07/20 1313       Assessment   Medical Diagnosis R TKA    Referring Provider (PT) Gaynelle Arabian, MD    Onset Date/Surgical Date 08/11/20    Next MD Visit 10/21/20      Observation/Other Assessments   Focus on Therapeutic Outcomes (FOTO)  Knee = 62 (31 pt improvement since eval)      AROM   Right Knee Extension 5   in LAQ; 3 when supported after manual therapy   Right Knee Flexion 117      Strength   Right Hip Flexion 4/5    Right Hip Extension 4/5    Right Hip ABduction 4/5    Right Hip ADduction 4+/5    Left Hip Flexion 4/5    Left Hip Extension 4+/5    Left Hip ABduction 4+/5    Left Hip ADduction 4+/5    Right Knee Flexion 4+/5    Right Knee Extension 4+/5    Left Knee Flexion 5/5    Left Knee Extension 4+/5    Right Ankle Dorsiflexion 4+/5    Left Ankle Dorsiflexion 4+/5                           [facility] Adult PT  Treatment/Exercise - 10/07/20 1313       Ambulation/Gait   Ambulation/Gait Assistance 6: Modified independent (Device/Increase time);7: Independent    Ambulation/Gait Assistance Details cues for increased hip and knee flexion with increased stride length to promote improved foot clearance and normal step pattern w/o cane    Assistive device Straight cane;None    Stairs Assistance 5: Supervision    Stair Management Technique One rail Right;Alternating pattern;Forwards    Number of Stairs 14    Height of Stairs 7    Gait Comments Pt switching cane between R and L hand depending on L knee pain.      Exercises   Exercises Knee/Hip      Knee/Hip Exercises: Aerobic   Nustep L5 x 6 min (UE/LE)      Knee/Hip Exercises: Standing   Hip Flexion Right;Left;10 reps;Stengthening;Knee bent    Hip Flexion Limitations march with looped red TB at midfoot    Terminal Knee Extension Right;10 reps;Strengthening;Theraband    Theraband Level (Terminal Knee Extension) Level 4 (Blue)    Terminal Knee Extension Limitations cues for quad activation, avoiding posterior hip thrust    Hip Abduction Right;Left;10 reps;Stengthening;Knee straight    Abduction Limitations looped red TB at ankles; UE support on back of chair    Hip Extension Right;Left;10 reps;Stengthening;Knee straight    Extension Limitations looped red TB at ankles; UE support on back of chair      Manual Therapy   Manual Therapy Joint mobilization;Passive ROM    Joint Mobilization R patellar mobs - multi-directional, focused on knee ext; APs femur with tibia block grade 3-4 with superior patellar mobs; end range knee flexion with tib on fem mobs grade 4    Passive ROM gentle overpressure into R knee extension with foot elevated on rolling stool in longsitting                    PT Education - 10/07/20 1408     Education Details HEP review & update - Access Code: [email or web address]    Person(s) Educated Patient    Methods  Explanation;Demonstration;Verbal cues;Handout    Comprehension Verbalized understanding;Verbal cues required;Returned demonstration  PT Long Term Goals - 10/07/20 1318       PT LONG TERM GOAL #1   Title Patient will be independent with ongoing/advanced HEP for self-management at home    Status Achieved   10/07/20     PT LONG TERM GOAL #2   Title Decrease R knee pain by >/= 50-75% allowing patient increased ease of exercise and mobility    Status Achieved   09/25/20 - 50% decrease in pain     PT LONG TERM GOAL #3   Title Patient will demonstrate R knee AROM >/= 2-120 dg to allow for normal gait and stair mechanics    Status Not Met   10/07/20 - R knee AROM 5-117 with extension down to 3 when supported     PT LONG TERM GOAL #4   Title Patient will demonstrate improved R LE strength to >/= 4 to 4+/5 for improved stability and ease of mobility    Status Achieved   10/07/20     PT LONG TERM GOAL #5   Title Patient to demonstrate symmetrical step length, knee flexion, and good heel-toe pattern with upright posture when ambulating with or w/o SPC or LRAD    Status Partially Met   10/07/20 - Improved gait pattern with and w/o AD with increased step length and foot clearnace with cues, but often reverts to Parkinsonian shuffling gait                  Plan - 10/07/20 1319     Clinical Impression Statement Morgan Hall reports no further pain in her R knee s/p TKA but states her L knee continues to have intermittent low-level pain. She uses the cane intermittently now, typically only when going out and varies with use of cane in R vs L hand depending on how her L knee is feeling. Her gait has improved, but at times she defaults to more of a Parkinsonian shuffling gait with decreased foot clearance and decreased stride length. She can navigate stairs reciprocally with UE support on railing but does not feel as confident and struggles with L eccentric lowering due to L knee  pain. Her R knee AROM is now 5-117 degrees with extension down to 3 when LE supported after manual therapy. Her overall LE strength is now >/= 4/5 with greatest weakness still proximally at hips. She is independent with an ongoing HEP to continue to address ROM and strength deficits. Majority of goal met or nearly met. Morgan Hall will now transition to her HEP and remain on a 30-day hold pending f/u with surgeon and may return to PT at MD direction or if issues arise with ongoing HEP.    Comorbidities OA, anxiety, DM, HTN, HLD, heart murmur    Rehab Potential Good    PT Treatment/Interventions ADLs/Self Care Home Management;Cryotherapy;Electrical Stimulation;Iontophoresis 44m/ml Dexamethasone;Moist Heat;DME Instruction;Gait training;Stair training;Functional mobility training;Therapeutic activities;Therapeutic exercise;Balance training;Neuromuscular re-education;Patient/family education;Manual techniques;Scar mobilization;Passive range of motion;Dry needling;Taping;Vasopneumatic Device;Joint Manipulations    PT Next Visit Plan transition to HEP + 30-day hold    PT Home Exercise Plan Access Code: [email or web address](6/16, updated 8/9)    Consulted and Agree with Plan of Care Patient;Family member/caregiver    Family Member Consulted husband - Alex             Patient will benefit from skilled therapeutic intervention in order to improve the following deficits and impairments:  Abnormal gait, Decreased activity tolerance, Decreased balance, Decreased endurance, Decreased knowledge of precautions, Decreased knowledge of use of DME,  Decreased mobility, Decreased range of motion, Decreased safety awareness, Decreased scar mobility, Decreased strength, Difficulty walking, Increased edema, Increased fascial restricitons, Increased muscle spasms, Impaired perceived functional ability, Impaired flexibility, Impaired sensation, Improper body mechanics, Postural dysfunction, Pain  Visit Diagnosis: Stiffness of right  knee, not elsewhere classified  Acute pain of right knee  Other abnormalities of gait and mobility  Difficulty in walking, not elsewhere classified  Muscle weakness (generalized)  Localized edema     Problem List Patient Active Problem List   Diagnosis Date Noted   OA (osteoarthritis) of knee 08/11/2020    Percival Spanish, PT, MPT 10/07/2020, 3:24 PM  [facility] [address]  [place], [place], [address] Phone: [phone]   Fax:  [phone]  Name: Morgan Hall MRN: [id number] Date of Birth: May 02, 1945

## 2020-12-11 DIAGNOSIS — M1712 Unilateral primary osteoarthritis, left knee: Secondary | ICD-10-CM | POA: Diagnosis not present

## 2020-12-16 IMAGING — US US EXTREM LOW VENOUS*R*
1 series · 13 of 24 positions shown · non-contrast
Comparison: None.

CLINICAL DATA: Pain



[Series 1: us extrem low venous*right* · 13 of 34 slices shown]
[im 1/34]
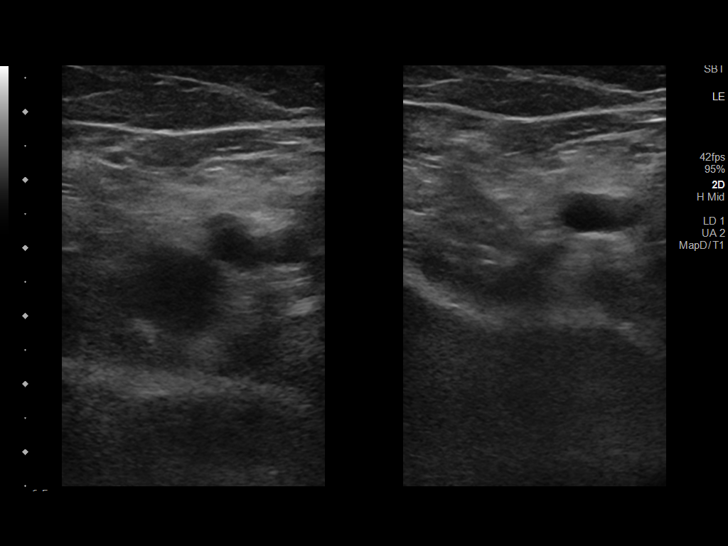
[im 3/34]
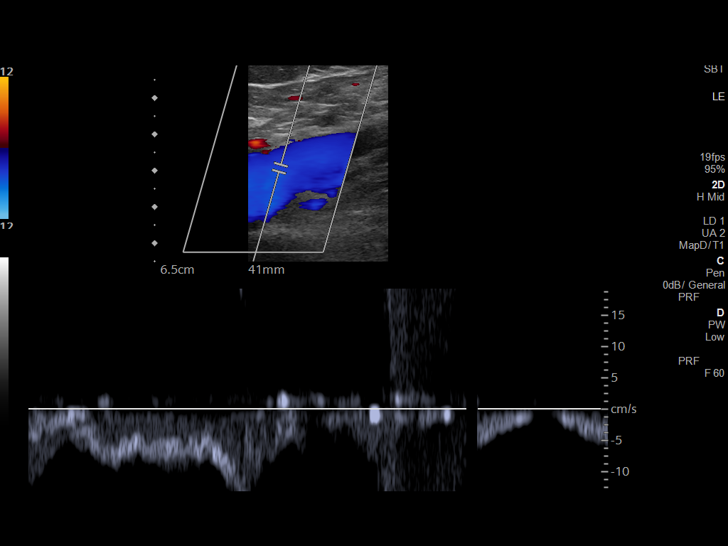
[im 6/34]
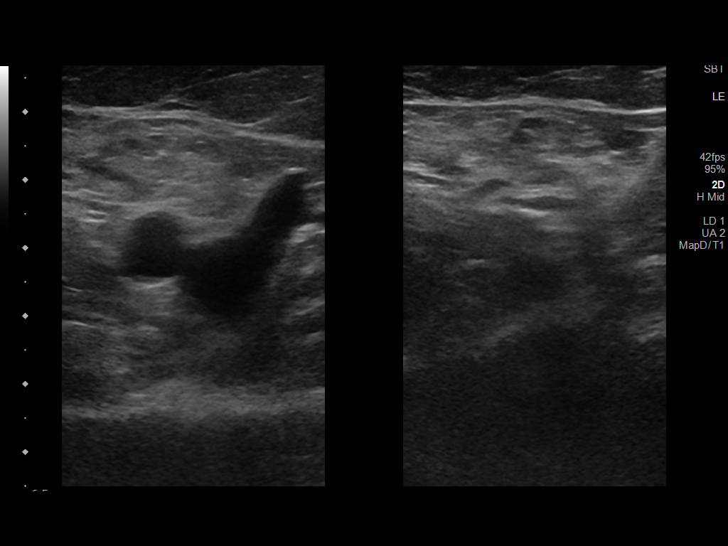
[im 9/34]
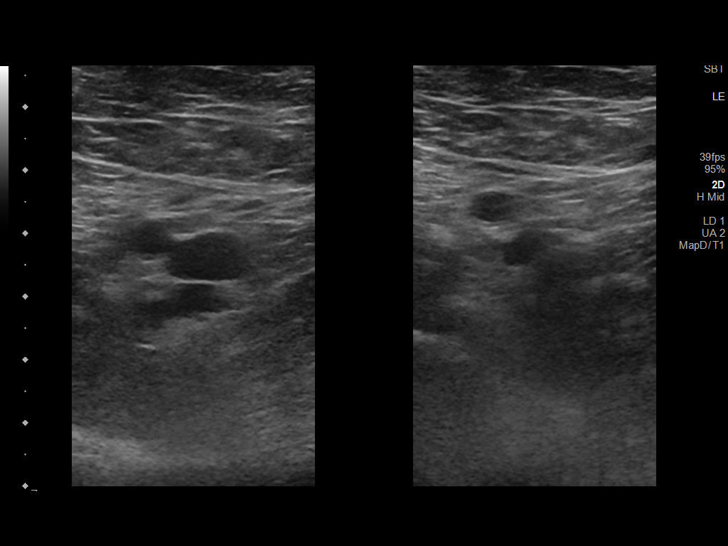
[im 12/34]
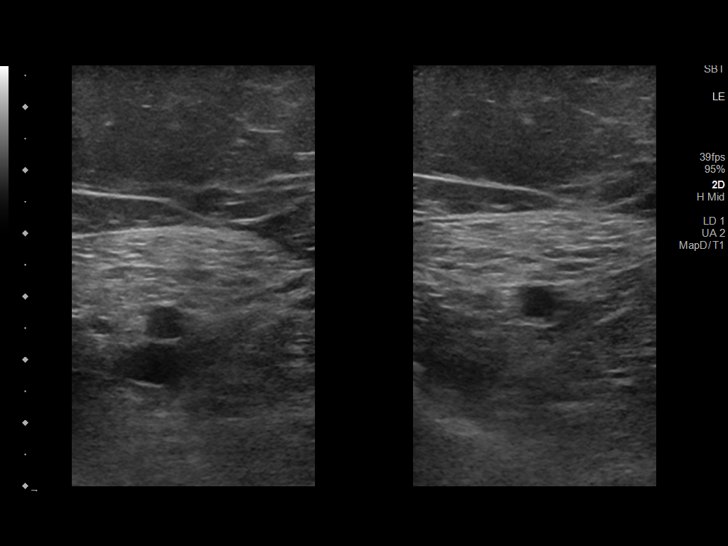
[im 15/34]
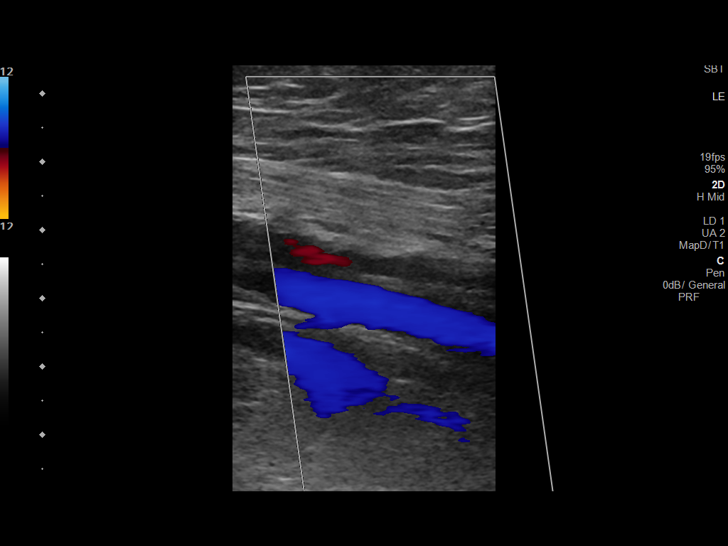
[im 18/34]
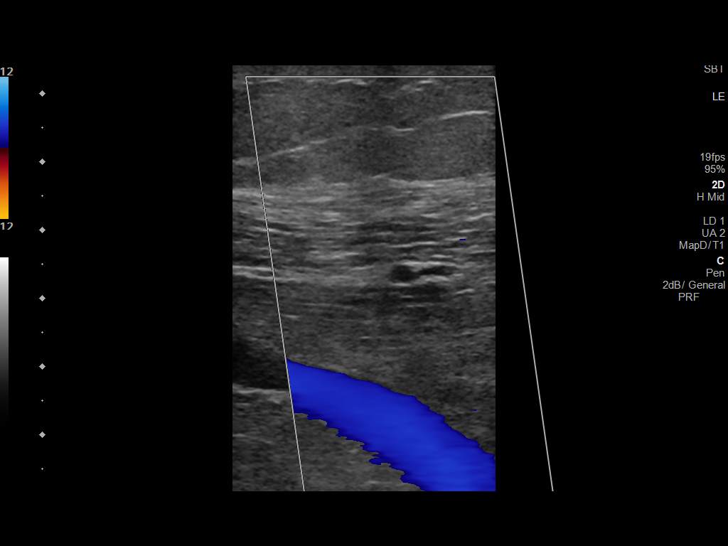
[im 19/34]
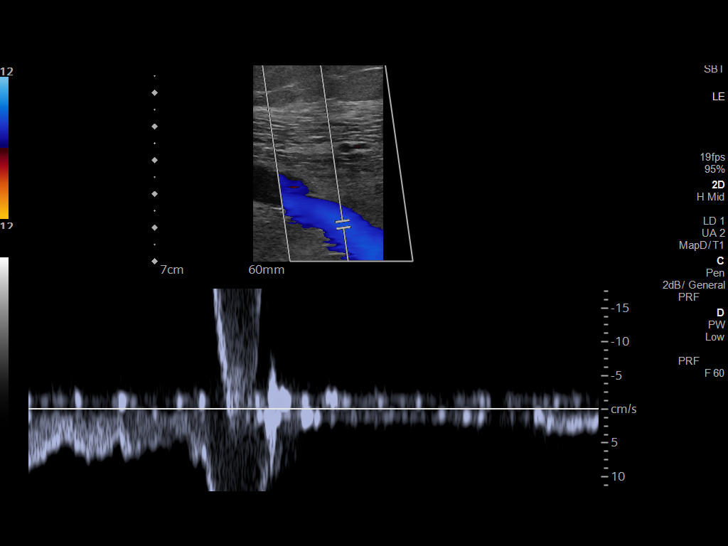
[im 22/34]
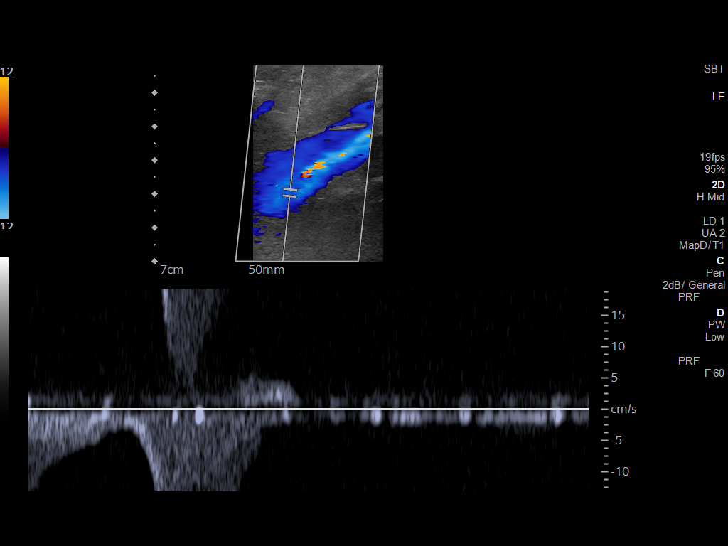
[im 25/34]
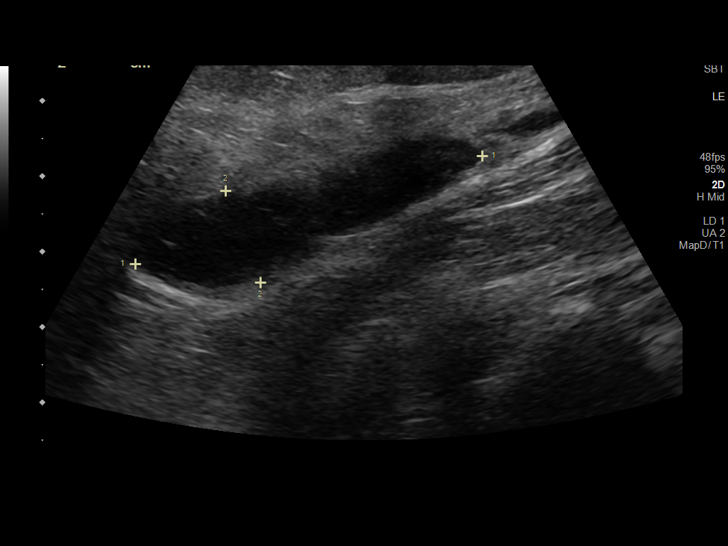
[im 28/34]
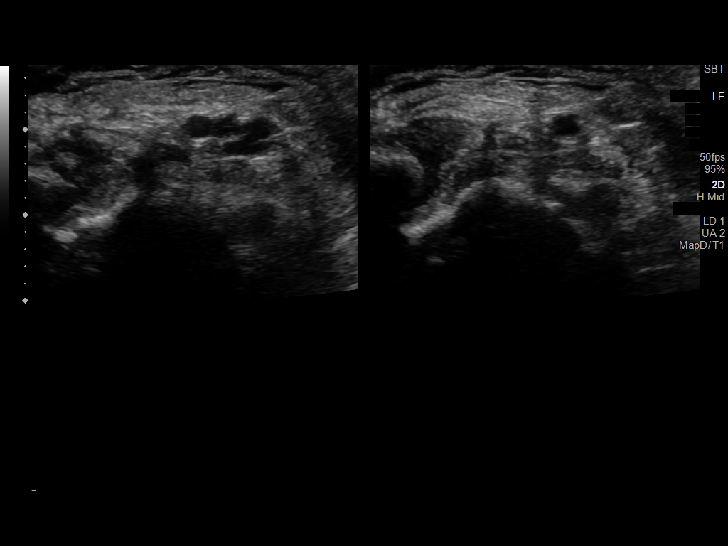
[im 31/34]
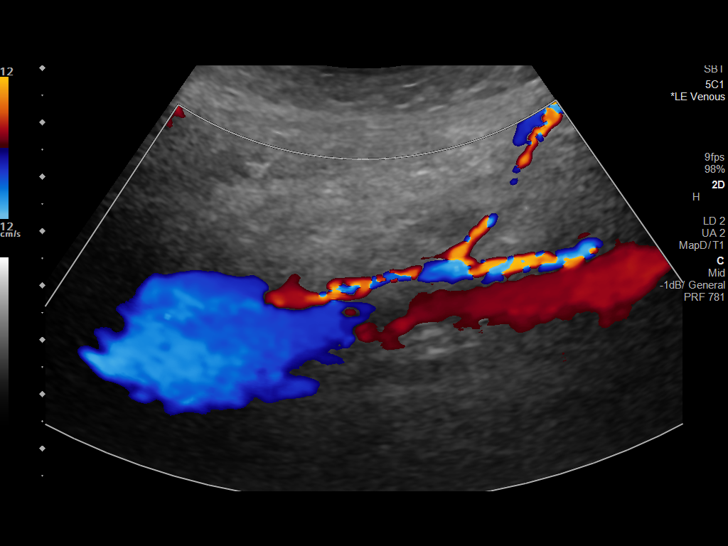
[im 34/34]
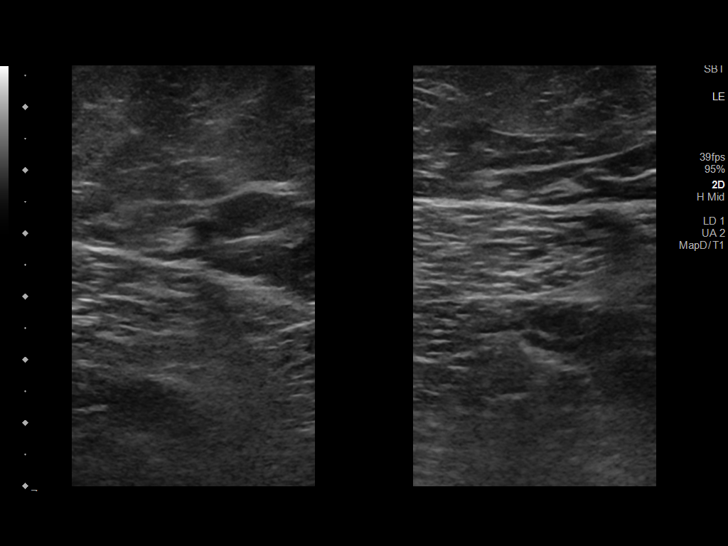

[13 of 24 positions shown; findings below may reference images not displayed]

FINDINGS: Contralateral Common Femoral Vein: Respiratory phasicity is normal
and symmetric with the symptomatic side. No evidence of thrombus.
Normal compressibility.

Common Femoral Vein: No evidence of thrombus. Normal
compressibility, respiratory phasicity and response to augmentation.

Saphenofemoral Junction: No evidence of thrombus. Normal
compressibility and flow on color Doppler imaging.

Profunda Femoral Vein: No evidence of thrombus. Normal
compressibility and flow on color Doppler imaging.

Femoral Vein: No evidence of thrombus. Normal compressibility,
respiratory phasicity and response to augmentation.

Popliteal Vein: No evidence of thrombus. Normal compressibility,
respiratory phasicity and response to augmentation.

Calf Veins: No evidence of thrombus. Normal compressibility and flow
on color Doppler imaging.

Superficial Great Saphenous Vein: No evidence of thrombus. Normal
compressibility.

Venous Reflux:  Not evaluated on this exam.

Other Findings: There is right lower extremity edema. There is a
x 1.3 x 5.1 cm Baker's cyst that appears to have ruptured in has
dissected inferiorly.
IMPRESSION: 1. No sonographic evidence for a DVT.
2. Right-sided dissecting Baker's cyst.

## 2020-12-18 DIAGNOSIS — M1712 Unilateral primary osteoarthritis, left knee: Secondary | ICD-10-CM | POA: Diagnosis not present

## 2020-12-25 DIAGNOSIS — M1712 Unilateral primary osteoarthritis, left knee: Secondary | ICD-10-CM | POA: Diagnosis not present

## 2021-01-01 DIAGNOSIS — E119 Type 2 diabetes mellitus without complications: Secondary | ICD-10-CM | POA: Diagnosis not present

## 2021-01-01 DIAGNOSIS — E785 Hyperlipidemia, unspecified: Secondary | ICD-10-CM | POA: Diagnosis not present

## 2021-01-01 DIAGNOSIS — I10 Essential (primary) hypertension: Secondary | ICD-10-CM | POA: Diagnosis not present

## 2021-01-08 DIAGNOSIS — E119 Type 2 diabetes mellitus without complications: Secondary | ICD-10-CM | POA: Diagnosis not present

## 2021-01-08 DIAGNOSIS — E785 Hyperlipidemia, unspecified: Secondary | ICD-10-CM | POA: Diagnosis not present

## 2021-01-08 DIAGNOSIS — I10 Essential (primary) hypertension: Secondary | ICD-10-CM | POA: Diagnosis not present

## 2021-01-29 DIAGNOSIS — R251 Tremor, unspecified: Secondary | ICD-10-CM | POA: Diagnosis not present

## 2021-02-12 DIAGNOSIS — M1712 Unilateral primary osteoarthritis, left knee: Secondary | ICD-10-CM | POA: Diagnosis not present

## 2021-02-12 DIAGNOSIS — R251 Tremor, unspecified: Secondary | ICD-10-CM | POA: Diagnosis not present

## 2021-03-26 DIAGNOSIS — R251 Tremor, unspecified: Secondary | ICD-10-CM | POA: Diagnosis not present

## 2021-04-05 DIAGNOSIS — J069 Acute upper respiratory infection, unspecified: Secondary | ICD-10-CM | POA: Diagnosis not present

## 2021-04-05 DIAGNOSIS — Z20822 Contact with and (suspected) exposure to covid-19: Secondary | ICD-10-CM | POA: Diagnosis not present

## 2021-04-05 DIAGNOSIS — J40 Bronchitis, not specified as acute or chronic: Secondary | ICD-10-CM | POA: Diagnosis not present

## 2021-04-15 DIAGNOSIS — R059 Cough, unspecified: Secondary | ICD-10-CM | POA: Diagnosis not present

## 2021-04-15 DIAGNOSIS — J40 Bronchitis, not specified as acute or chronic: Secondary | ICD-10-CM | POA: Diagnosis not present

## 2021-05-21 DIAGNOSIS — R251 Tremor, unspecified: Secondary | ICD-10-CM | POA: Diagnosis not present

## 2021-05-29 DIAGNOSIS — M1712 Unilateral primary osteoarthritis, left knee: Secondary | ICD-10-CM | POA: Diagnosis not present

## 2021-05-29 DIAGNOSIS — M1711 Unilateral primary osteoarthritis, right knee: Secondary | ICD-10-CM | POA: Diagnosis not present

## 2021-05-29 DIAGNOSIS — Z96651 Presence of right artificial knee joint: Secondary | ICD-10-CM | POA: Diagnosis not present

## 2021-05-29 DIAGNOSIS — M25562 Pain in left knee: Secondary | ICD-10-CM | POA: Diagnosis not present

## 2021-05-29 DIAGNOSIS — M170 Bilateral primary osteoarthritis of knee: Secondary | ICD-10-CM | POA: Diagnosis not present

## 2021-07-16 DIAGNOSIS — R251 Tremor, unspecified: Secondary | ICD-10-CM | POA: Diagnosis not present

## 2021-08-05 DIAGNOSIS — M1712 Unilateral primary osteoarthritis, left knee: Secondary | ICD-10-CM | POA: Diagnosis not present

## 2021-08-12 DIAGNOSIS — M1712 Unilateral primary osteoarthritis, left knee: Secondary | ICD-10-CM | POA: Diagnosis not present

## 2021-10-22 DIAGNOSIS — R251 Tremor, unspecified: Secondary | ICD-10-CM | POA: Diagnosis not present

## 2021-11-03 DIAGNOSIS — I10 Essential (primary) hypertension: Secondary | ICD-10-CM | POA: Diagnosis not present

## 2021-11-03 DIAGNOSIS — E119 Type 2 diabetes mellitus without complications: Secondary | ICD-10-CM | POA: Diagnosis not present

## 2021-11-03 DIAGNOSIS — E785 Hyperlipidemia, unspecified: Secondary | ICD-10-CM | POA: Diagnosis not present

## 2021-11-10 DIAGNOSIS — I10 Essential (primary) hypertension: Secondary | ICD-10-CM | POA: Diagnosis not present

## 2021-11-10 DIAGNOSIS — E119 Type 2 diabetes mellitus without complications: Secondary | ICD-10-CM | POA: Diagnosis not present

## 2021-11-10 DIAGNOSIS — E785 Hyperlipidemia, unspecified: Secondary | ICD-10-CM | POA: Diagnosis not present

## 2021-11-10 DIAGNOSIS — F411 Generalized anxiety disorder: Secondary | ICD-10-CM | POA: Diagnosis not present

## 2021-11-10 DIAGNOSIS — E782 Mixed hyperlipidemia: Secondary | ICD-10-CM | POA: Diagnosis not present

## 2022-01-15 DIAGNOSIS — R251 Tremor, unspecified: Secondary | ICD-10-CM | POA: Diagnosis not present

## 2022-02-01 DIAGNOSIS — E119 Type 2 diabetes mellitus without complications: Secondary | ICD-10-CM | POA: Diagnosis not present

## 2022-02-04 DIAGNOSIS — E119 Type 2 diabetes mellitus without complications: Secondary | ICD-10-CM | POA: Diagnosis not present

## 2022-02-04 DIAGNOSIS — I10 Essential (primary) hypertension: Secondary | ICD-10-CM | POA: Diagnosis not present

## 2022-02-04 DIAGNOSIS — G20A1 Parkinson's disease without dyskinesia, without mention of fluctuations: Secondary | ICD-10-CM | POA: Diagnosis not present

## 2022-02-04 DIAGNOSIS — E785 Hyperlipidemia, unspecified: Secondary | ICD-10-CM | POA: Diagnosis not present
